# Patient Record
Sex: Female | Born: 1955
Health system: Southern US, Community
[De-identification: ages and names within clinical notes are randomized; demographics above are authoritative.]

## PROBLEM LIST (undated history)

## (undated) DIAGNOSIS — R232 Flushing: Secondary | ICD-10-CM

## (undated) DIAGNOSIS — J439 Emphysema, unspecified: Secondary | ICD-10-CM

## (undated) DIAGNOSIS — M545 Low back pain, unspecified: Secondary | ICD-10-CM

## (undated) DIAGNOSIS — I341 Nonrheumatic mitral (valve) prolapse: Secondary | ICD-10-CM

## (undated) DIAGNOSIS — Z720 Tobacco use: Secondary | ICD-10-CM

## (undated) DIAGNOSIS — E782 Mixed hyperlipidemia: Secondary | ICD-10-CM

## (undated) DIAGNOSIS — F419 Anxiety disorder, unspecified: Secondary | ICD-10-CM

## (undated) HISTORY — DX: Mixed hyperlipidemia: E78.2

## (undated) HISTORY — DX: Flushing: R23.2

## (undated) HISTORY — DX: Tobacco use: Z72.0

## (undated) HISTORY — PX: OTHER SURGICAL HISTORY: SHX169

## (undated) HISTORY — DX: Anxiety disorder, unspecified: F41.9

## (undated) HISTORY — DX: Low back pain, unspecified: M54.50

## (undated) HISTORY — PX: COLONOSCOPY: SHX174

## (undated) HISTORY — DX: Low back pain: M54.5

## (undated) HISTORY — DX: Emphysema, unspecified: J43.9

---

## 1999-06-03 ENCOUNTER — Encounter: Payer: Self-pay | Admitting: Obstetrics and Gynecology

## 1999-06-03 ENCOUNTER — Encounter: Admission: RE | Admit: 1999-06-03 | Discharge: 1999-06-03 | Payer: Self-pay | Admitting: Obstetrics and Gynecology

## 1999-11-15 ENCOUNTER — Emergency Department (HOSPITAL_COMMUNITY): Admission: EM | Admit: 1999-11-15 | Discharge: 1999-11-15 | Payer: Self-pay | Admitting: Emergency Medicine

## 2000-02-08 ENCOUNTER — Encounter: Payer: Self-pay | Admitting: Obstetrics and Gynecology

## 2000-02-08 ENCOUNTER — Encounter: Admission: RE | Admit: 2000-02-08 | Discharge: 2000-02-08 | Payer: Self-pay | Admitting: Obstetrics and Gynecology

## 2000-04-25 ENCOUNTER — Encounter (INDEPENDENT_AMBULATORY_CARE_PROVIDER_SITE_OTHER): Payer: Self-pay | Admitting: Specialist

## 2000-04-25 ENCOUNTER — Other Ambulatory Visit: Admission: RE | Admit: 2000-04-25 | Discharge: 2000-04-25 | Payer: Self-pay | Admitting: Obstetrics and Gynecology

## 2000-07-13 ENCOUNTER — Other Ambulatory Visit: Admission: RE | Admit: 2000-07-13 | Discharge: 2000-07-13 | Payer: Self-pay | Admitting: Obstetrics and Gynecology

## 2000-07-17 ENCOUNTER — Encounter: Payer: Self-pay | Admitting: Obstetrics and Gynecology

## 2000-07-17 ENCOUNTER — Encounter: Admission: RE | Admit: 2000-07-17 | Discharge: 2000-07-17 | Payer: Self-pay | Admitting: Obstetrics and Gynecology

## 2001-08-12 ENCOUNTER — Encounter: Admission: RE | Admit: 2001-08-12 | Discharge: 2001-08-12 | Payer: Self-pay | Admitting: Obstetrics and Gynecology

## 2001-08-12 ENCOUNTER — Encounter: Payer: Self-pay | Admitting: Obstetrics and Gynecology

## 2002-08-22 ENCOUNTER — Encounter: Admission: RE | Admit: 2002-08-22 | Discharge: 2002-08-22 | Payer: Self-pay | Admitting: Obstetrics and Gynecology

## 2002-08-22 ENCOUNTER — Encounter: Payer: Self-pay | Admitting: Obstetrics and Gynecology

## 2002-09-16 ENCOUNTER — Other Ambulatory Visit: Admission: RE | Admit: 2002-09-16 | Discharge: 2002-09-16 | Payer: Self-pay | Admitting: Obstetrics and Gynecology

## 2003-09-15 ENCOUNTER — Encounter: Admission: RE | Admit: 2003-09-15 | Discharge: 2003-09-15 | Payer: Self-pay | Admitting: Obstetrics and Gynecology

## 2004-09-15 ENCOUNTER — Encounter: Admission: RE | Admit: 2004-09-15 | Discharge: 2004-09-15 | Payer: Self-pay | Admitting: Obstetrics and Gynecology

## 2005-04-28 ENCOUNTER — Emergency Department (HOSPITAL_COMMUNITY): Admission: EM | Admit: 2005-04-28 | Discharge: 2005-04-28 | Payer: Self-pay | Admitting: Emergency Medicine

## 2005-09-18 ENCOUNTER — Encounter: Admission: RE | Admit: 2005-09-18 | Discharge: 2005-09-18 | Payer: Self-pay | Admitting: Obstetrics and Gynecology

## 2006-09-20 ENCOUNTER — Encounter: Admission: RE | Admit: 2006-09-20 | Discharge: 2006-09-20 | Payer: Self-pay | Admitting: Obstetrics and Gynecology

## 2007-03-06 ENCOUNTER — Ambulatory Visit (HOSPITAL_BASED_OUTPATIENT_CLINIC_OR_DEPARTMENT_OTHER): Admission: RE | Admit: 2007-03-06 | Discharge: 2007-03-06 | Payer: Self-pay | Admitting: Obstetrics and Gynecology

## 2007-03-06 ENCOUNTER — Encounter (INDEPENDENT_AMBULATORY_CARE_PROVIDER_SITE_OTHER): Payer: Self-pay | Admitting: Obstetrics and Gynecology

## 2007-10-17 ENCOUNTER — Encounter: Admission: RE | Admit: 2007-10-17 | Discharge: 2007-10-17 | Payer: Self-pay | Admitting: Obstetrics and Gynecology

## 2008-02-17 ENCOUNTER — Emergency Department (HOSPITAL_COMMUNITY): Admission: EM | Admit: 2008-02-17 | Discharge: 2008-02-17 | Payer: Self-pay | Admitting: Emergency Medicine

## 2008-10-20 ENCOUNTER — Encounter: Admission: RE | Admit: 2008-10-20 | Discharge: 2008-10-20 | Payer: Self-pay | Admitting: Obstetrics and Gynecology

## 2009-12-02 ENCOUNTER — Encounter: Admission: RE | Admit: 2009-12-02 | Discharge: 2009-12-02 | Payer: Self-pay | Admitting: Obstetrics and Gynecology

## 2009-12-16 ENCOUNTER — Encounter: Admission: RE | Admit: 2009-12-16 | Discharge: 2009-12-16 | Payer: Self-pay | Admitting: Obstetrics and Gynecology

## 2010-03-05 ENCOUNTER — Encounter: Payer: Self-pay | Admitting: Obstetrics and Gynecology

## 2010-05-30 LAB — CBC
HCT: 43.1 % (ref 36.0–46.0)
Hemoglobin: 14.9 g/dL (ref 12.0–15.0)
MCHC: 34.6 g/dL (ref 30.0–36.0)
Platelets: 320 10*3/uL (ref 150–400)
RDW: 13.1 % (ref 11.5–15.5)

## 2010-05-30 LAB — POCT I-STAT, CHEM 8
BUN: 14 mg/dL (ref 6–23)
Calcium, Ion: 0.98 mmol/L — ABNORMAL LOW (ref 1.12–1.32)
Chloride: 106 mEq/L (ref 96–112)
Creatinine, Ser: 1 mg/dL (ref 0.4–1.2)
Glucose, Bld: 92 mg/dL (ref 70–99)
TCO2: 25 mmol/L (ref 0–100)

## 2010-05-30 LAB — DIFFERENTIAL
Basophils Absolute: 0.1 10*3/uL (ref 0.0–0.1)
Basophils Relative: 1 % (ref 0–1)
Eosinophils Absolute: 0 10*3/uL (ref 0.0–0.7)
Eosinophils Relative: 1 % (ref 0–5)
Lymphocytes Relative: 27 % (ref 12–46)
Monocytes Absolute: 0.5 10*3/uL (ref 0.1–1.0)

## 2010-05-30 LAB — POCT CARDIAC MARKERS
Troponin i, poc: <0.05 ng/mL (ref 0.00–0.09)
Troponin i, poc: <0.05 ng/mL (ref 0.00–0.09)

## 2010-06-10 ENCOUNTER — Other Ambulatory Visit: Payer: Self-pay | Admitting: Obstetrics & Gynecology

## 2010-06-28 NOTE — Op Note (Signed)
NAMELEZLI, DANEK NO.:  [id number]   MEDICAL RECORD NO.:  [id number]          PATIENT TYPE:  AMB   LOCATION:  [facility]                         FACILITY:  [facility]   PHYSICIAN:  Sherry A. Dickstein, M.D.DATE OF BIRTH:  July 21, 1955   DATE OF PROCEDURE:  03/06/2007  DATE OF DISCHARGE:                               OPERATIVE REPORT   PREOPERATIVE DIAGNOSES:  Abnormal Papanicolaou smear, endometrial polyp.   POSTOPERATIVE DIAGNOSES:  Abnormal Papanicolaou smear, endometrial  polyp.   OPERATIVE PROCEDURE:  Dilatation and curettage, hysteroscopy with  resectoscope.   SURGEON:  Sherry A. Rosalio Macadamia, M.D.   ANESTHESIA:  Monitored anesthesia care.   INDICATIONS FOR PROCEDURE:  This is a 55 year old G-3, P-2, 0-1-2 woman  who has had no vaginal bleeding except for late spotting in July.  Her  last true flow was December 2007.  The patient had an abnormal Pap smear  which was worked up with a normal colposcopy with biopsies, however, she  had an ultrasound which revealed thickened endometrium.  The patient  then had a sonohysterogram which revealed an endometrial polyp present.  Because of the presence of endometrial polyp and her past history of  endometrial hyperplasia, the patient is brought to the operating room  for dilatation and curettage hysteroscopy with resectoscope.   FINDINGS:  Normal sized anteflexed uterus, no adnexal mass with small  endometrial polyp.   DESCRIPTION OF PROCEDURE:  The patient was brought into the operating  room and given adequate intravenous sedation.  She was placed in the  dorsal lithotomy position.  Her perineum was washed with Betadine.  Pelvic examination was performed.  She was draped in a sterile fashion.  Speculum was placed within the vagina.  The vagina was washed with  Betadine.  Paracervical block was administered with 1% Nesacaine.  The  anterior lip of the cervix was grasped with a single tooth tenaculum.  Cervix was dilated  with Pratt dilators up to a #31.  The hysteroscope  was introduced into the endometrial cavity.  Pictures were obtained.  Using a double looped right angled resector, the endometrial polyp was  removed with endometrial resections.  The endometrial tissue was sampled  circumferentially very superficially.  Once this was done, adequate  hemostasis was present.  Pictures were obtained before and after the  resections were obtained.  The instruments were removed from the vagina.  The patient was taken out of dorsal lithotomy position.  She was  awakened.  She was removed from the operating table to a stretcher in  stable condition.   COMPLICATIONS:  None.   ESTIMATED BLOOD LOSS:  Less than 5 mL.   SPECIMEN:  Endometrial resections with endometrial polyp sent to  pathology.      Sherry A. Rosalio Macadamia, M.D.  Electronically Signed     SAD/MEDQ  D:  03/06/2007  T:  03/06/2007  Job:  [id number]

## 2010-07-01 NOTE — Consult Note (Signed)
NAMEDONABELLE, MOLDEN NO.:  [id number]   MEDICAL RECORD NO.:  [id number]          PATIENT TYPE:  EMS   LOCATION:  [facility]                         FACILITY:  [facility]   PHYSICIAN:  Francisca December, M.D.  DATE OF BIRTH:  [birth year]   DATE OF CONSULTATION:  04/28/2005  DATE OF DISCHARGE:                                   CONSULTATION   PHYSICIANS:  Cardiologist: Francisca December, M.D.  Primary care physician: Susa Raring, M.D.   CHIEF COMPLAINT:  Chest pain, palpitations, and left arm numbness and  tingling.   HISTORY OF PRESENT ILLNESS:  Mrs. Zurcher is a 55 year old Caucasian female  with a history of mitral valve prolapse on SBE prophylaxis and tobacco  abuse.  This morning on her drive into work, she complained of left arm  numbness and tingling for approximately 2 to 3 hours.  Upon arrival to work,  she experienced dizziness without syncope. She has a history of palpitations  with the onset of chest discomfort.  For the past 2 weeks, she has  experienced intermittent palpitations with sharp chest pain in the left mid  sternal area.  This recurred this morning.  The pain was rated a 3 to 4/10  and did not radiate.  The patient complained of shortness of breath that was  relieved with laying her head down.  She also complained of nausea for a few  seconds without vomiting.  She denied diaphoresis.  While at work, she took  aspirin 325 mg x1 without pain relief.  She married to the [facility]  Emergency Department by EMS.  While in the EMS vehicle, she was given  sublingual nitroglycerin x2 with pain improvement.  Upon arrival to the  emergency department, a 12-lead EKG was obtained which revealed normal sinus  rhythm with a ventricular rate of 76 beats per minute.  The patient was  placed on oxygen via nasal cannula at 2 liters per minute.  She is currently  pain free, however, admits to continued, intermittent palpitations and left  arm numbness and tingling.  Her  point-of-care cardiac markers are negative  x1.  The patient had a normal stress Cardiolite in October 2006 with EF of  72%.   PAST MEDICAL HISTORY:  1.  Mitral valve prolapse on SBE prophylaxis.  2.  Tobacco abuse.   ALLERGIES:  No known drug allergies.   MEDICATIONS:  Xanax, unknown dosage, as needed for palpitations.   PAST SURGICAL HISTORY:  Questionable colposcopy for abnormal Pap smear.   FAMILY HISTORY:  Father deceased at age 47 of an MI.   SOCIAL HISTORY:  Married 25 years with 2 sons, ages 29 and 48.  She lives  with her husband. The patient admits to tobacco abuse.  She has been smoking  1 to 1-1/2 packs of cigarettes for 17 years.  She admits to occasional  alcohol use; however, denies illicit drug use.  She denies a current  exercise regimen.   REVIEW OF SYSTEMS:  The patient admits to chest pain, palpitations, left arm  numbness and tingling, nausea, shortness of breath, dizziness, occasional  lower  extremity edema.  She denies diaphoresis, syncope, vomiting.  All  other systems reviewed are negative other than what is stated in the HPI.   PHYSICAL EXAMINATION:  GENERAL:  A 55 year old female, pleasant and  cooperative, no acute distress.  VITAL SIGNS: Temperature 98, pulse 68, respirations 20, blood pressure  118/68, O2 saturation 99% on 2 liters.  HEENT: Unremarkable.  NECK:  Supple without JVD or carotid bruits bilaterally.  LUNGS: Breath sounds are equal and clear to auscultation bilaterally without  wheezes, rhonchi, crackles, or rales.  HEART: Regular rate and rhythm.  S1 and S2 normal without murmurs, gallops,  clicks, or rubs  ABDOMEN:  Soft nontender, nondistended, with active bowel sounds.  No aortic  or iliac bruits.  No masses or hepatomegaly.  SKIN: Warm and dry without rash or lesion.  NEUROLOGIC: Alert and oriented x3.  No focal deficits.  PSYCHIATRIC: Normal mood and affect.   LABORATORY DATA:  Point-of-care cardiac enzymes: Myoglobin 37, CK-MB  less  than 1, troponin less than 0.05.   EKG: Normal sinus rhythm with a ventricular rate of 76 beats per minute.   Chest x-ray: No active cardiopulmonary disease.   ASSESSMENT:  1.  Atypical angina associated with single palpitation.  2.  Mitral valve prolapse; I cannot hear a click.  3.  Tobacco abuse, negative alcohol.  4.  Positive family history.  First set of cardiac markers are negative.  No      reflux symptoms.   PLAN:  1.  Obtain repeat point-of-care cardiac markers as patient is considered low      risk.  If negative, the patient can be discharged with a prescription      for sublingual nitroglycerin and encouraged to begin aspirin 325 mg      daily.  2.  Arrange for exercise Cardiolite next week in the office.  3.  Return to the emergency room promptly for recurrent chest pain.  4.  The patient was seen and examined by Dr. Amil Amen who participated in the      medical decision making and plan of care.   ADDENDUM:  It should be noted that the second set of point-of-care markers  were negative with a myoglobin of 40.1, CK-MB less than 1, and troponin I  0.05.  The patient continues to deny chest pain.  She is being discharged to  home in stable condition with an prescription for sublingual nitroglycerin.  She will also be started on enteric-coated  aspirin 325 mg p.o. daily. The patient has been scheduled for an exercise  stress Cardiolite test on Thursday, May 04, 2005, at 8:30 a.m. in the  [facility].  Instructions prior to this test will be mailed to  the patient's home address to include nothing by mouth except for  medications after midnight and no smoking the day of the test.      Tylene Fantasia, PA      Francisca December, M.D.  Electronically Signed    RDM/MEDQ  D:  04/28/2005  T:  04/29/2005  Job:  [id number]   cc:   Susa Raring, M.D.

## 2010-11-03 LAB — POCT HEMOGLOBIN-HEMACUE
Hemoglobin: 15
Operator id: 268271

## 2010-11-28 ENCOUNTER — Other Ambulatory Visit: Payer: Self-pay | Admitting: Obstetrics & Gynecology

## 2010-11-28 DIAGNOSIS — Z1231 Encounter for screening mammogram for malignant neoplasm of breast: Secondary | ICD-10-CM

## 2010-12-19 ENCOUNTER — Ambulatory Visit
Admission: RE | Admit: 2010-12-19 | Discharge: 2010-12-19 | Disposition: A | Discharge status: Home / Self Care | Payer: Managed Care, Other (non HMO) | Source: Ambulatory Visit | Attending: Obstetrics & Gynecology | Admitting: Obstetrics & Gynecology

## 2010-12-19 DIAGNOSIS — Z1231 Encounter for screening mammogram for malignant neoplasm of breast: Secondary | ICD-10-CM

## 2011-09-13 ENCOUNTER — Emergency Department (HOSPITAL_BASED_OUTPATIENT_CLINIC_OR_DEPARTMENT_OTHER)
Admission: EM | Admit: 2011-09-13 | Discharge: 2011-09-13 | Disposition: A | Discharge status: Home / Self Care | Payer: Managed Care, Other (non HMO) | Attending: Emergency Medicine | Admitting: Emergency Medicine

## 2011-09-13 ENCOUNTER — Encounter (HOSPITAL_BASED_OUTPATIENT_CLINIC_OR_DEPARTMENT_OTHER): Payer: Self-pay

## 2011-09-13 ENCOUNTER — Emergency Department (HOSPITAL_BASED_OUTPATIENT_CLINIC_OR_DEPARTMENT_OTHER): Payer: Managed Care, Other (non HMO)

## 2011-09-13 DIAGNOSIS — Z7982 Long term (current) use of aspirin: Secondary | ICD-10-CM | POA: Insufficient documentation

## 2011-09-13 DIAGNOSIS — F17200 Nicotine dependence, unspecified, uncomplicated: Secondary | ICD-10-CM | POA: Insufficient documentation

## 2011-09-13 DIAGNOSIS — R002 Palpitations: Secondary | ICD-10-CM | POA: Insufficient documentation

## 2011-09-13 DIAGNOSIS — R0789 Other chest pain: Secondary | ICD-10-CM | POA: Insufficient documentation

## 2011-09-13 DIAGNOSIS — R0602 Shortness of breath: Secondary | ICD-10-CM | POA: Insufficient documentation

## 2011-09-13 HISTORY — DX: Nonrheumatic mitral (valve) prolapse: I34.1

## 2011-09-13 LAB — COMPREHENSIVE METABOLIC PANEL
ALT: 31 U/L (ref 0–35)
Albumin: 4.2 g/dL (ref 3.5–5.2)
Alkaline Phosphatase: 81 U/L (ref 39–117)
Chloride: 103 mEq/L (ref 96–112)
Glucose, Bld: 98 mg/dL (ref 70–99)
Potassium: 3.8 mEq/L (ref 3.5–5.1)
Sodium: 140 mEq/L (ref 135–145)
Total Bilirubin: 0.2 mg/dL — ABNORMAL LOW (ref 0.3–1.2)
Total Protein: 7.1 g/dL (ref 6.0–8.3)

## 2011-09-13 LAB — URINALYSIS, ROUTINE W REFLEX MICROSCOPIC
Glucose, UA: NEGATIVE mg/dL
Leukocytes, UA: NEGATIVE
Nitrite: NEGATIVE
Specific Gravity, Urine: 1.007 (ref 1.005–1.030)
pH: 7 (ref 5.0–8.0)

## 2011-09-13 LAB — CBC WITH DIFFERENTIAL/PLATELET
Basophils Relative: 1 % (ref 0–1)
Eosinophils Absolute: 0.1 10*3/uL (ref 0.0–0.7)
Hemoglobin: 13.5 g/dL (ref 12.0–15.0)
Lymphs Abs: 3.3 10*3/uL (ref 0.7–4.0)
MCH: 30.5 pg (ref 26.0–34.0)
Monocytes Relative: 7 % (ref 3–12)
Neutro Abs: 5.7 10*3/uL (ref 1.7–7.7)
Neutrophils Relative %: 58 % (ref 43–77)
Platelets: 256 10*3/uL (ref 150–400)
RBC: 4.43 MIL/uL (ref 3.87–5.11)
WBC: 9.8 10*3/uL (ref 4.0–10.5)

## 2011-09-13 LAB — CARDIAC PANEL(CRET KIN+CKTOT+MB+TROPI)
Relative Index: 1.6 (ref 0.0–2.5)
Relative Index: 1.5 (ref 0.0–2.5)

## 2011-09-13 LAB — D-DIMER, QUANTITATIVE: D-Dimer, Quant: <0.22 ug/mL-FEU (ref 0.00–0.48)

## 2011-09-13 NOTE — ED Notes (Signed)
C/o palpitations x 1 week-today while seated at work started having sharp pain to left arm and left shoulder-denies sob, "normal hot flashes", no cold sweat,n/v

## 2011-09-13 NOTE — ED Provider Notes (Signed)
History     CSN: [id number]  Arrival date & time 09/13/11  1143   First MD Initiated Contact with Patient 09/13/11 1201      Chief Complaint  Patient presents with  . Palpitations    (Consider location/radiation/quality/duration/timing/severity/associated sxs/prior treatment) HPI Comments: Patient states history of mitral valve prolapse is having palpitations intermittently for the past week. She has severe episode today while sitting at work and developed shooting pain in her left arm and shoulder. This pain lasted about 5 minutes and resolved. She also had pain in her left upper back. She's not had pain like this in the past. No shortness of breath, vomiting, vomiting, fever, chills. No chest pain. She reports having a negative stress test in 2006 but none since. Denies any history of heart attack. Currently feels back to normal. Has not seen her cardiologist at [facility] in several years.  The history is provided by the patient.    Past Medical History  Diagnosis Date  . MVP (mitral valve prolapse)     History reviewed. No pertinent past surgical history.  No family history on file.  History  Substance Use Topics  . Smoking status: Current Everyday Smoker  . Smokeless tobacco: Not on file  . Alcohol Use: Yes    OB History    Grav Para Term Preterm Abortions TAB SAB Ect Mult Living                  Review of Systems  Constitutional: Negative for fever, activity change and appetite change.  HENT: Negative for congestion and rhinorrhea.   Eyes: Negative for visual disturbance.  Respiratory: Negative for cough, chest tightness and shortness of breath.   Cardiovascular: Positive for palpitations. Negative for chest pain.  Gastrointestinal: Negative for nausea, vomiting and abdominal pain.  Genitourinary: Negative for dysuria, hematuria, vaginal bleeding and vaginal discharge.  Musculoskeletal: Negative for back pain.  Skin: Negative for rash.  Neurological: Negative  for dizziness, weakness and headaches.    Allergies  Review of patient's allergies indicates no known allergies.  Home Medications   Current Outpatient Rx  Name Route Sig Dispense Refill  . ALPRAZOLAM 0.5 MG PO TABS Oral Take 0.5-1 mg by mouth at bedtime as needed. anxiety    . AMOXICILLIN 500 MG PO CAPS Oral Take 2,000 mg by mouth once. Before dental appt.    . ASPIRIN 81 MG PO TABS Oral Take 81 mg by mouth daily.    . ATORVASTATIN CALCIUM 40 MG PO TABS Oral Take 40 mg by mouth daily.    Marland Kitchen GLUCOSAMINE COMPLEX PO Oral Take by mouth.    . IBUPROFEN 200 MG PO CAPS Oral Take 400 mg by mouth daily as needed. pain      BP 134/81  Pulse 75  Temp 97.9 F (36.6 C) (Oral)  Resp 20  Ht 5\' 6"  (1.676 m)  Wt 154 lb (69.854 kg)  BMI 24.86 kg/m2  SpO2 100%  Physical Exam  Constitutional: She is oriented to person, place, and time. She appears well-developed and well-nourished.  HENT:  Head: Normocephalic and atraumatic.  Mouth/Throat: Oropharynx is clear and moist. No oropharyngeal exudate.  Eyes: Conjunctivae and EOM are normal. Pupils are equal, round, and reactive to light.  Neck: Normal range of motion. Neck supple.  Cardiovascular: Normal rate, regular rhythm and normal heart sounds.   Pulmonary/Chest: Effort normal and breath sounds normal. No respiratory distress.  Abdominal: Soft. There is no tenderness. There is no rebound and no  guarding.  Musculoskeletal: Normal range of motion. She exhibits no edema and no tenderness.       Equal grip strength bilaterally  Neurological: She is alert and oriented to person, place, and time. No cranial nerve deficit.  Skin: Skin is warm.    ED Course  Procedures (including critical care time)  Labs Reviewed  COMPREHENSIVE METABOLIC PANEL - Abnormal; Notable for the following:    Total Bilirubin 0.2 (*)     GFR calc non Af Amer 81 (*)     All other components within normal limits  CBC WITH DIFFERENTIAL  CARDIAC PANEL(CRET  KIN+CKTOT+MB+TROPI)  URINALYSIS, ROUTINE W REFLEX MICROSCOPIC  D-DIMER, QUANTITATIVE   Dg Chest 2 View  09/13/2011  *RADIOLOGY REPORT*  Clinical Data: Short of breath  CHEST - 2 VIEW  Comparison: 02/17/2008  Findings: Normal heart size.  There is a focal opacity projecting over the lower lumbar spine on the lateral view that is not clearly defined on the PA view.  The no pneumothorax.  No pleural effusion. No acute bony deformity.  IMPRESSION: Basilar atelectasis worse airspace disease on the lateral view only.  Follow-up studies until resolution are recommended.  Original Report Authenticated By: Donavan Burnet, M.D.     No diagnosis found.    MDM  Palpitations that have been intermittent for the past week associated with left arm pain today for 5 minutes but is now resolved. Vital stable, no distress. EKG with inferior lateral ST depressions which are similar to previous.  Chest x-ray negative. EKG nonischemic, stable inferolateral ST depressions, T wave inversion lead 3 Shooting arm pain atypical for ACS. Troponin negative, d-dimer negative. D/w Dr. Eldridge Dace of [facility] cardiology.  Agrees symptoms atypical for angina.  Agrees with d-dimer and 2 sets of cardiac enzymes for outpatient stress.  D-dimer negative. Delta troponin negative. Patient pain-free though still having the palpitations intermittently.  No arrhythmias on monitor..   Rate: 89  Rhythm: normal sinus rhythm  QRS Axis: normal  Intervals: normal  ST/T Wave abnormalities: ST depressions inferiorly and ST depressions laterally, TWI lead 3  Conduction Disutrbances:none  Narrative Interpretation: ST depressions appears stable from 2010  Old EKG Reviewed: unchanged   Glynn Octave, MD 09/13/11 1601

## 2012-01-22 ENCOUNTER — Other Ambulatory Visit: Payer: Self-pay | Admitting: Obstetrics & Gynecology

## 2012-01-22 DIAGNOSIS — Z1231 Encounter for screening mammogram for malignant neoplasm of breast: Secondary | ICD-10-CM

## 2012-02-23 ENCOUNTER — Ambulatory Visit
Admission: RE | Admit: 2012-02-23 | Discharge: 2012-02-23 | Disposition: A | Discharge status: Home / Self Care | Payer: Managed Care, Other (non HMO) | Source: Ambulatory Visit | Attending: Obstetrics & Gynecology | Admitting: Obstetrics & Gynecology

## 2012-02-23 DIAGNOSIS — Z1231 Encounter for screening mammogram for malignant neoplasm of breast: Secondary | ICD-10-CM

## 2012-05-15 ENCOUNTER — Other Ambulatory Visit: Payer: Self-pay | Admitting: Dermatology

## 2013-02-24 ENCOUNTER — Other Ambulatory Visit: Payer: Self-pay

## 2013-02-24 DIAGNOSIS — Z1231 Encounter for screening mammogram for malignant neoplasm of breast: Secondary | ICD-10-CM

## 2013-03-17 ENCOUNTER — Ambulatory Visit
Admission: RE | Admit: 2013-03-17 | Discharge: 2013-03-17 | Disposition: A | Discharge status: Home / Self Care | Payer: Self-pay | Source: Ambulatory Visit

## 2013-03-17 DIAGNOSIS — Z1231 Encounter for screening mammogram for malignant neoplasm of breast: Secondary | ICD-10-CM

## 2013-10-29 ENCOUNTER — Emergency Department (HOSPITAL_BASED_OUTPATIENT_CLINIC_OR_DEPARTMENT_OTHER): Payer: Managed Care, Other (non HMO)

## 2013-10-29 ENCOUNTER — Emergency Department (HOSPITAL_BASED_OUTPATIENT_CLINIC_OR_DEPARTMENT_OTHER)
Admission: EM | Admit: 2013-10-29 | Discharge: 2013-10-29 | Disposition: A | Discharge status: Home / Self Care | Payer: Managed Care, Other (non HMO) | Attending: Emergency Medicine | Admitting: Emergency Medicine

## 2013-10-29 ENCOUNTER — Encounter (HOSPITAL_BASED_OUTPATIENT_CLINIC_OR_DEPARTMENT_OTHER): Payer: Self-pay | Admitting: Emergency Medicine

## 2013-10-29 DIAGNOSIS — Z791 Long term (current) use of non-steroidal anti-inflammatories (NSAID): Secondary | ICD-10-CM | POA: Diagnosis not present

## 2013-10-29 DIAGNOSIS — Z8679 Personal history of other diseases of the circulatory system: Secondary | ICD-10-CM | POA: Diagnosis not present

## 2013-10-29 DIAGNOSIS — R079 Chest pain, unspecified: Secondary | ICD-10-CM | POA: Diagnosis present

## 2013-10-29 DIAGNOSIS — F17200 Nicotine dependence, unspecified, uncomplicated: Secondary | ICD-10-CM | POA: Diagnosis not present

## 2013-10-29 DIAGNOSIS — Z7982 Long term (current) use of aspirin: Secondary | ICD-10-CM | POA: Diagnosis not present

## 2013-10-29 DIAGNOSIS — R0789 Other chest pain: Secondary | ICD-10-CM | POA: Diagnosis not present

## 2013-10-29 DIAGNOSIS — Z792 Long term (current) use of antibiotics: Secondary | ICD-10-CM | POA: Diagnosis not present

## 2013-10-29 DIAGNOSIS — Z79899 Other long term (current) drug therapy: Secondary | ICD-10-CM | POA: Insufficient documentation

## 2013-10-29 LAB — COMPREHENSIVE METABOLIC PANEL
ALBUMIN: 3.9 g/dL (ref 3.5–5.2)
ALK PHOS: 82 U/L (ref 39–117)
ALT: 30 U/L (ref 0–35)
AST: 26 U/L (ref 0–37)
Anion gap: 14 (ref 5–15)
BUN: 15 mg/dL (ref 6–23)
CALCIUM: 9.3 mg/dL (ref 8.4–10.5)
CO2: 25 mEq/L (ref 19–32)
Chloride: 103 mEq/L (ref 96–112)
Creatinine, Ser: 0.9 mg/dL (ref 0.50–1.10)
GFR calc non Af Amer: 69 mL/min — ABNORMAL LOW (ref >90)
GFR, EST AFRICAN AMERICAN: 80 mL/min — AB (ref >90)
Glucose, Bld: 99 mg/dL (ref 70–99)
POTASSIUM: 3.8 meq/L (ref 3.7–5.3)
SODIUM: 142 meq/L (ref 137–147)
TOTAL PROTEIN: 7.2 g/dL (ref 6.0–8.3)
Total Bilirubin: 0.3 mg/dL (ref 0.3–1.2)

## 2013-10-29 LAB — CBC WITH DIFFERENTIAL/PLATELET
BASOS ABS: 0 10*3/uL (ref 0.0–0.1)
BASOS PCT: 0 % (ref 0–1)
EOS ABS: 0.2 10*3/uL (ref 0.0–0.7)
EOS PCT: 2 % (ref 0–5)
HCT: 39.4 % (ref 36.0–46.0)
Hemoglobin: 13.1 g/dL (ref 12.0–15.0)
Lymphocytes Relative: 30 % (ref 12–46)
Lymphs Abs: 2.8 10*3/uL (ref 0.7–4.0)
MCH: 30.5 pg (ref 26.0–34.0)
MCHC: 33.2 g/dL (ref 30.0–36.0)
MCV: 91.6 fL (ref 78.0–100.0)
Monocytes Absolute: 0.5 10*3/uL (ref 0.1–1.0)
Monocytes Relative: 6 % (ref 3–12)
NEUTROS PCT: 62 % (ref 43–77)
Neutro Abs: 5.7 10*3/uL (ref 1.7–7.7)
PLATELETS: 275 10*3/uL (ref 150–400)
RBC: 4.3 MIL/uL (ref 3.87–5.11)
RDW: 12.9 % (ref 11.5–15.5)
WBC: 9.2 10*3/uL (ref 4.0–10.5)

## 2013-10-29 LAB — TROPONIN I: Troponin I: <0.3 ng/mL (ref <0.30)

## 2013-10-29 NOTE — ED Notes (Signed)
Pt states she took her xanax around 11:30 with onset of this pressure, pt states "I think it is kicking in right now, I'm feeling better."

## 2013-10-29 NOTE — Discharge Instructions (Signed)

## 2013-10-29 NOTE — ED Provider Notes (Signed)
CSN: [id number]     Arrival date & time 10/29/13  1233 History   First MD Initiated Contact with Patient 10/29/13 1244     Chief Complaint  Patient presents with  . Chest Pain     (Consider location/radiation/quality/duration/timing/severity/associated sxs/prior Treatment) HPI Comments: Patient is a 58 year old female with no prior cardiac history. She presents with complaints of pressure in her chest that she states has been coming and going for the past 2-3 months. It occurred again today at work and was worse than before, so she presents for evaluation. She denies any nausea, diaphoresis, shortness of breath, or radiation to the arm or jaw. She did take a Xanax which seems to be helping. She had no relief with nitroglycerin by EMS.  Patient is a 58 y.o. female presenting with chest pain. The history is provided by the patient.  Chest Pain Pain location:  Substernal area Pain quality: pressure   Pain radiates to:  Does not radiate Pain radiates to the back: no   Pain severity:  Moderate Onset quality:  Gradual Duration:  2 hours Timing:  Intermittent Progression:  Partially resolved Chronicity:  New   Past Medical History  Diagnosis Date  . MVP (mitral valve prolapse)    History reviewed. No pertinent past surgical history. History reviewed. No pertinent family history. History  Substance Use Topics  . Smoking status: Current Every Day Smoker  . Smokeless tobacco: Not on file  . Alcohol Use: Yes   OB History   Grav Para Term Preterm Abortions TAB SAB Ect Mult Living                 Review of Systems  Cardiovascular: Positive for chest pain.  All other systems reviewed and are negative.     Allergies  Review of patient's allergies indicates no known allergies.  Home Medications   Prior to Admission medications   Medication Sig Start Date End Date Taking? Authorizing Provider  ALPRAZolam Duanne Moron) 0.5 MG tablet Take 0.5-1 mg by mouth at bedtime as needed.  anxiety    Historical Provider, MD  amoxicillin (AMOXIL) 500 MG capsule Take 2,000 mg by mouth once. Before dental appt.    Historical Provider, MD  aspirin 81 MG tablet Take 81 mg by mouth daily.    Historical Provider, MD  atorvastatin (LIPITOR) 40 MG tablet Take 40 mg by mouth daily.    Historical Provider, MD  Boswellia-Glucosamine-Vit D (GLUCOSAMINE COMPLEX PO) Take by mouth.    Historical Provider, MD  Ibuprofen (ADVIL) 200 MG CAPS Take 400 mg by mouth daily as needed. pain    Historical Provider, MD   BP 135/73  Pulse 73  Temp(Src) 98.5 F (36.9 C) (Oral)  Resp 16  Ht 5\' 5"  (1.651 m)  Wt 154 lb (69.854 kg)  BMI 25.63 kg/m2  SpO2 96% Physical Exam  Nursing note and vitals reviewed. Constitutional: She is oriented to person, place, and time. She appears well-developed and well-nourished. No distress.  HENT:  Head: Normocephalic and atraumatic.  Neck: Normal range of motion. Neck supple.  Cardiovascular: Normal rate and regular rhythm.  Exam reveals no gallop and no friction rub.   No murmur heard. Pulmonary/Chest: Effort normal and breath sounds normal. No respiratory distress. She has no wheezes.  Abdominal: Soft. Bowel sounds are normal. She exhibits no distension. There is no tenderness.  Musculoskeletal: Normal range of motion.  Neurological: She is alert and oriented to person, place, and time.  Skin: Skin is warm and dry.  She is not diaphoretic.    ED Course  Procedures (including critical care time) Labs Review Labs Reviewed - No data to display  Imaging Review No results found.   Date: 10/29/2013  Rate: 69  Rhythm: normal sinus rhythm  QRS Axis: normal  Intervals: normal  ST/T Wave abnormalities: normal  Conduction Disutrbances:none  Narrative Interpretation:   Old EKG Reviewed: unchanged    MDM   Final diagnoses:  None    Patient is a 58 year old female who presents with complaints of pressure in her chest. She's been having these symptoms for  the past several months. She felt lightheaded today at work and presents here for evaluation. Blood counts and electrolytes are unremarkable an EKG and troponin did not suggest a cardiac etiology. Patient was kept in the emergency department for a second troponin which was also negative. She has remained symptom free throughout her emergency department stay. Her symptoms are atypical and workup is unremarkable and I strongly doubt a cardiac etiology. She will be discharged to home with instructions to followup with her primary Dr., but understands to return if her symptoms substantially worsen or change.    Veryl Speak, MD 10/30/13 [phone]

## 2013-10-29 NOTE — ED Notes (Signed)
Family at bedside. 

## 2013-10-29 NOTE — ED Notes (Signed)
Pt to room 10 by ems via stretcher. Able to stand and pivot to bed. Pt reports intermittent pressure to her left chest x 2-3 months. Pt also reports feeling "lightheaded" at times for the last 2-3 months. Pt states she is trying to quit smoking and the pressure seems to coincide with when she smokes a cigarette. Pt states she is under a lot of stress at work, and feels that is contributing to her symptoms. ems gave 324mg  baby asa and one ntg sl, with no change in pt symptoms. Iv placed prior to arrival, ekg ordered.

## 2013-12-01 ENCOUNTER — Ambulatory Visit: Payer: Managed Care, Other (non HMO) | Admitting: Interventional Cardiology

## 2013-12-22 ENCOUNTER — Encounter: Payer: Self-pay | Admitting: Interventional Cardiology

## 2013-12-22 ENCOUNTER — Ambulatory Visit (INDEPENDENT_AMBULATORY_CARE_PROVIDER_SITE_OTHER): Payer: Managed Care, Other (non HMO) | Admitting: Interventional Cardiology

## 2013-12-22 VITALS — BP 138/76 | HR 84 | Ht 65.0 in | Wt 158.1 lb

## 2013-12-22 DIAGNOSIS — Z720 Tobacco use: Secondary | ICD-10-CM

## 2013-12-22 DIAGNOSIS — R42 Dizziness and giddiness: Secondary | ICD-10-CM

## 2013-12-22 DIAGNOSIS — I340 Nonrheumatic mitral (valve) insufficiency: Secondary | ICD-10-CM

## 2013-12-22 NOTE — Patient Instructions (Signed)
Your physician wants you to follow-up in: 12 months with Dr Glennon Hamilton will receive a reminder letter in the mail two months in advance. If you don't receive a letter, please call our office to schedule the follow-up appointment.

## 2013-12-22 NOTE — Progress Notes (Signed)
Patient ID: Courtney Hernandez, female   DOB: 1955-10-26, 58 y.o.   MRN: [id number]    [address], [address] [place], [place]  [address] Phone: [phone] Fax:  [phone]  Date:  12/22/2013   ID:  Omnia, Dollinger [birth year], MRN [id number]  PCP:  Gara Kroner, MD      History of Present Illness: Courtney Hernandez is a 58 y.o. female who has a history of mitral valve prolapse. This was diagnosed several years ago. She has had episodic fluttering in her chest as well as some chest discomfort. In September 2015, she did go to the emergency room. She had a negative workup at that time. She has been trying to quit smoking. She has cut back to about 5 cigarettes a day. After decreasing her cigarettes, her chest discomfort improved in the fluttering improved as well. She has had some lightheadedness but this has also improved since cutting back on cigarettes. She does admit to not drinking a lot of water during the day. She is trying to get back to regular walking. She has no chest discomfort with walking. Of note, she had a negative stress test in August 2013. Left ventricular function was normal on echocardiogram in 2013 as well. Overall, she is feeling better.   Wt Readings from Last 3 Encounters:  12/22/13 158 lb 1.9 oz (71.723 kg)  10/29/13 154 lb (69.854 kg)  09/13/11 154 lb (69.854 kg)     Past Medical History  Diagnosis Date  . MVP (mitral valve prolapse)     Current Outpatient Prescriptions  Medication Sig Dispense Refill  . aspirin 81 MG tablet Take 81 mg by mouth daily.    Marland Kitchen atorvastatin (LIPITOR) 40 MG tablet Take 40 mg by mouth daily.    . clonazePAM (KLONOPIN) 0.5 MG tablet   2   No current facility-administered medications for this visit.    Allergies:    Allergies  Allergen Reactions  . Chantix [Varenicline] Other (See Comments)    BAD DREAMS    Social History:  The patient  reports that she has been smoking.  She does not have any smokeless tobacco  history on file. She reports that she drinks alcohol. She reports that she does not use illicit drugs.   Family History:  The patient's family history includes Cancer in her maternal grandmother, sister, and sister; Heart attack in her father.   ROS:  Please see the history of present illness.  No nausea, vomiting.  No fevers, chills.  No focal weakness.  No dysuria. As above   All other systems reviewed and negative.   PHYSICAL EXAM: VS:  BP 138/76 mmHg  Pulse 84  Ht 5\' 5"  (1.651 m)  Wt 158 lb 1.9 oz (71.723 kg)  BMI 26.31 kg/m2 General: Well developed, well nourished, in no acute distress HEENT: normal Neck: no JVD, no carotid bruits Cardiac:  normal S1, S2; RRR;  Lungs:  clear to auscultation bilaterally, no wheezing, rhonchi or rales Abd: soft, nontender, no hepatomegaly Ext: no edema Skin: warm and dry Neuro:   no focal abnormalities noted Psych: normal affect  EKG:  NSR, NSST  ASSESSMENT AND PLAN:  1. Mitral regurgitation: Mild on last echocardiogram. No clear indication for SBE prophylaxis. She had been continuing to use SBE prophylaxis per her dentist. 2. Hyperlipidemia: She has tried to control this with diet. 3. Tobacco abuse: I encouraged her to stop smoking completely. 4. Dizziness: Improving.Try to stay better hydrated. She should  also decrease her caffeine intake.  Signed, Mina Marble, MD, [facility] 12/22/2013 4:48 PM

## 2013-12-26 ENCOUNTER — Encounter: Payer: Self-pay | Admitting: Interventional Cardiology

## 2015-03-14 ENCOUNTER — Encounter (HOSPITAL_COMMUNITY): Payer: Self-pay | Admitting: *Deleted

## 2015-03-14 ENCOUNTER — Emergency Department (HOSPITAL_COMMUNITY): Payer: BLUE CROSS/BLUE SHIELD

## 2015-03-14 ENCOUNTER — Emergency Department (HOSPITAL_COMMUNITY)
Admission: EM | Admit: 2015-03-14 | Discharge: 2015-03-14 | Disposition: A | Discharge status: Home / Self Care | Payer: BLUE CROSS/BLUE SHIELD | Attending: Emergency Medicine | Admitting: Emergency Medicine

## 2015-03-14 DIAGNOSIS — R079 Chest pain, unspecified: Secondary | ICD-10-CM | POA: Insufficient documentation

## 2015-03-14 DIAGNOSIS — Z8679 Personal history of other diseases of the circulatory system: Secondary | ICD-10-CM | POA: Insufficient documentation

## 2015-03-14 DIAGNOSIS — F17200 Nicotine dependence, unspecified, uncomplicated: Secondary | ICD-10-CM | POA: Diagnosis not present

## 2015-03-14 DIAGNOSIS — Z79899 Other long term (current) drug therapy: Secondary | ICD-10-CM | POA: Diagnosis not present

## 2015-03-14 DIAGNOSIS — Z7982 Long term (current) use of aspirin: Secondary | ICD-10-CM | POA: Insufficient documentation

## 2015-03-14 LAB — CBC
HCT: 43.1 % (ref 36.0–46.0)
Hemoglobin: 14.5 g/dL (ref 12.0–15.0)
MCH: 30.7 pg (ref 26.0–34.0)
MCHC: 33.6 g/dL (ref 30.0–36.0)
MCV: 91.1 fL (ref 78.0–100.0)
PLATELETS: 253 10*3/uL (ref 150–400)
RBC: 4.73 MIL/uL (ref 3.87–5.11)
RDW: 12.9 % (ref 11.5–15.5)
WBC: 8.4 10*3/uL (ref 4.0–10.5)

## 2015-03-14 LAB — I-STAT TROPONIN, ED
TROPONIN I, POC: 0.01 ng/mL (ref 0.00–0.08)
Troponin i, poc: 0 ng/mL (ref 0.00–0.08)

## 2015-03-14 LAB — BASIC METABOLIC PANEL
Anion gap: 13 (ref 5–15)
BUN: 14 mg/dL (ref 6–20)
CO2: 25 mmol/L (ref 22–32)
CREATININE: 0.8 mg/dL (ref 0.44–1.00)
Calcium: 9.3 mg/dL (ref 8.9–10.3)
Chloride: 104 mmol/L (ref 101–111)
GFR calc Af Amer: >60 mL/min (ref >60)
GLUCOSE: 103 mg/dL — AB (ref 65–99)
Potassium: 3.5 mmol/L (ref 3.5–5.1)
SODIUM: 142 mmol/L (ref 135–145)

## 2015-03-14 NOTE — ED Provider Notes (Signed)
CSN: [id number]     Arrival date & time 03/14/15  1623 History   First MD Initiated Contact with Patient 03/14/15 1807     Chief Complaint  Patient presents with  . Chest Pain     (Consider location/radiation/quality/duration/timing/severity/associated sxs/prior Treatment) HPI   4 y f w PMH MVP coming in with chest pain.  Chest pain was while driving today, lasted for <5 sec, during which she had central chest tightness w/o associated sx.  She has two episodes and is now chest pain free.  She has never had pain like this before.  She has h/o hl and family hx of cad but no personal hx.  Past Medical History  Diagnosis Date  . MVP (mitral valve prolapse)    History reviewed. No pertinent past surgical history. Family History  Problem Relation Age of Onset  . Heart attack Father   . Cancer Sister   . Cancer Maternal Grandmother   . Cancer Sister    Social History  Substance Use Topics  . Smoking status: Current Every Day Smoker  . Smokeless tobacco: None  . Alcohol Use: Yes   OB History    No data available     Review of Systems  Constitutional: Negative for fever and chills.  HENT: Negative for nosebleeds.   Eyes: Negative for visual disturbance.  Respiratory: Negative for cough and shortness of breath.   Cardiovascular: Positive for chest pain.  Gastrointestinal: Negative for nausea, vomiting, abdominal pain, diarrhea and constipation.  Genitourinary: Negative for dysuria.  Skin: Negative for rash.  Neurological: Negative for weakness.  All other systems reviewed and are negative.     Allergies  Chantix  Home Medications   Prior to Admission medications   Medication Sig Start Date End Date Taking? Authorizing Provider  aspirin 81 MG tablet Take 81 mg by mouth daily.   Yes Historical Provider, MD  atorvastatin (LIPITOR) 40 MG tablet Take 40 mg by mouth daily.   Yes Historical Provider, MD  clonazePAM (KLONOPIN) 0.5 MG tablet  11/11/13  Yes Historical  Provider, MD   BP 147/90 mmHg  Pulse 75  Temp(Src) 98.2 F (36.8 C) (Oral)  Resp 16  Ht 5\' 5"  (1.651 m)  Wt 69.854 kg  BMI 25.63 kg/m2  SpO2 99% Physical Exam  Constitutional: She is oriented to person, place, and time. No distress.  HENT:  Head: Normocephalic and atraumatic.  Eyes: EOM are normal. Pupils are equal, round, and reactive to light.  Neck: Normal range of motion. Neck supple.  Cardiovascular: Normal rate and intact distal pulses.   Pulmonary/Chest: No respiratory distress.  Abdominal: Soft. There is no tenderness.  Musculoskeletal: Normal range of motion.  Neurological: She is alert and oriented to person, place, and time.  Skin: No rash noted. She is not diaphoretic.  Psychiatric: She has a normal mood and affect.    ED Course  Procedures (including critical care time) Labs Review Labs Reviewed  BASIC METABOLIC PANEL - Abnormal; Notable for the following:    Glucose, Bld 103 (*)    All other components within normal limits  CBC  I-STAT TROPOININ, ED  Randolm Idol, ED    Imaging Review Dg Chest 2 View  03/14/2015  CLINICAL DATA:  Mid sternal chest pain twice today. EXAM: CHEST  2 VIEW COMPARISON:  10/29/2013 FINDINGS: Tapering of the peripheral pulmonary vasculature favors emphysema. The lungs appear otherwise clear. Cardiac and mediastinal margins appear normal. No pleural effusion. IMPRESSION: 1. Emphysema.  Otherwise, no significant  abnormalities are observed. Electronically Signed   By: Van Clines M.D.   On: 03/14/2015 17:15   I have personally reviewed and evaluated these images and lab results as part of my medical decision-making.   EKG Interpretation   Date/Time:  Sunday March 14 2015 16:26:18 EST Ventricular Rate:  109 PR Interval:  164 QRS Duration: 84 QT Interval:  336 QTC Calculation: 452 R Axis:   63 Text Interpretation:  Sinus tachycardia Biatrial enlargement Nonspecific  ST and T wave abnormality Abnormal ECG Confirmed  by ZAVITZ  MD, JOSHUA  ([id number]) on 03/14/2015 6:52:13 PM      MDM   Final diagnoses:  Chest pain, unspecified chest pain type   75 y f w PMH MVP coming in with chest pain.  Chest pain was while driving today, lasted for <5 sec  Exam as above unremarkable.  Considering acs, will obtain cbc/bmp/delta trop/cxr/ekg Doubt pe given no sob, no continuing pain, normal vitals.  Doubt dissection given pain was fleeting. Feel acs is also unlikely given fleeting chest pain.  Given very atypical chest pain feel that delta trop sufficient for evaluating for acs.  If  Delta trop neg, will d/c with pcp f/u  Delta trop neg, pt continues to be chest pain free.  I have discussed the results, Dx and Tx plan with the pt. They expressed understanding and agree with the plan and were told to return to ED with any worsening of condition or concern.    Disposition: Discharge  Condition: Good  Discharge Medication List as of 03/14/2015  8:45 PM      Follow Up: Antony Contras, MD [place] [id number] [phone]  In 2 days    Pt seen in conjunction with Dr. Johnney Ou, MD 03/15/15 0149  Elnora Morrison, MD 03/20/15 [phone]

## 2015-03-14 NOTE — ED Notes (Signed)
Pt reports traveling in car today and two episodes back to back of mid chest tightness. Denies sob. Pain free pta. ekg done at triage.

## 2015-03-14 NOTE — Discharge Instructions (Signed)

## 2015-06-22 ENCOUNTER — Other Ambulatory Visit: Payer: Self-pay | Admitting: Gastroenterology

## 2015-06-22 DIAGNOSIS — D125 Benign neoplasm of sigmoid colon: Secondary | ICD-10-CM | POA: Diagnosis not present

## 2015-06-22 DIAGNOSIS — D122 Benign neoplasm of ascending colon: Secondary | ICD-10-CM | POA: Diagnosis not present

## 2015-06-22 DIAGNOSIS — D126 Benign neoplasm of colon, unspecified: Secondary | ICD-10-CM | POA: Diagnosis not present

## 2015-06-22 DIAGNOSIS — Z1211 Encounter for screening for malignant neoplasm of colon: Secondary | ICD-10-CM | POA: Diagnosis not present

## 2015-06-22 DIAGNOSIS — K5730 Diverticulosis of large intestine without perforation or abscess without bleeding: Secondary | ICD-10-CM | POA: Diagnosis not present

## 2015-09-01 IMAGING — CR DG CHEST 2V
2 series · 2 of 2 positions shown · non-contrast
Comparison: 09/13/2011

CLINICAL DATA: Chest pain and lightheadedness.

EXAM:
CHEST - 2 VIEW

[w chest pa]
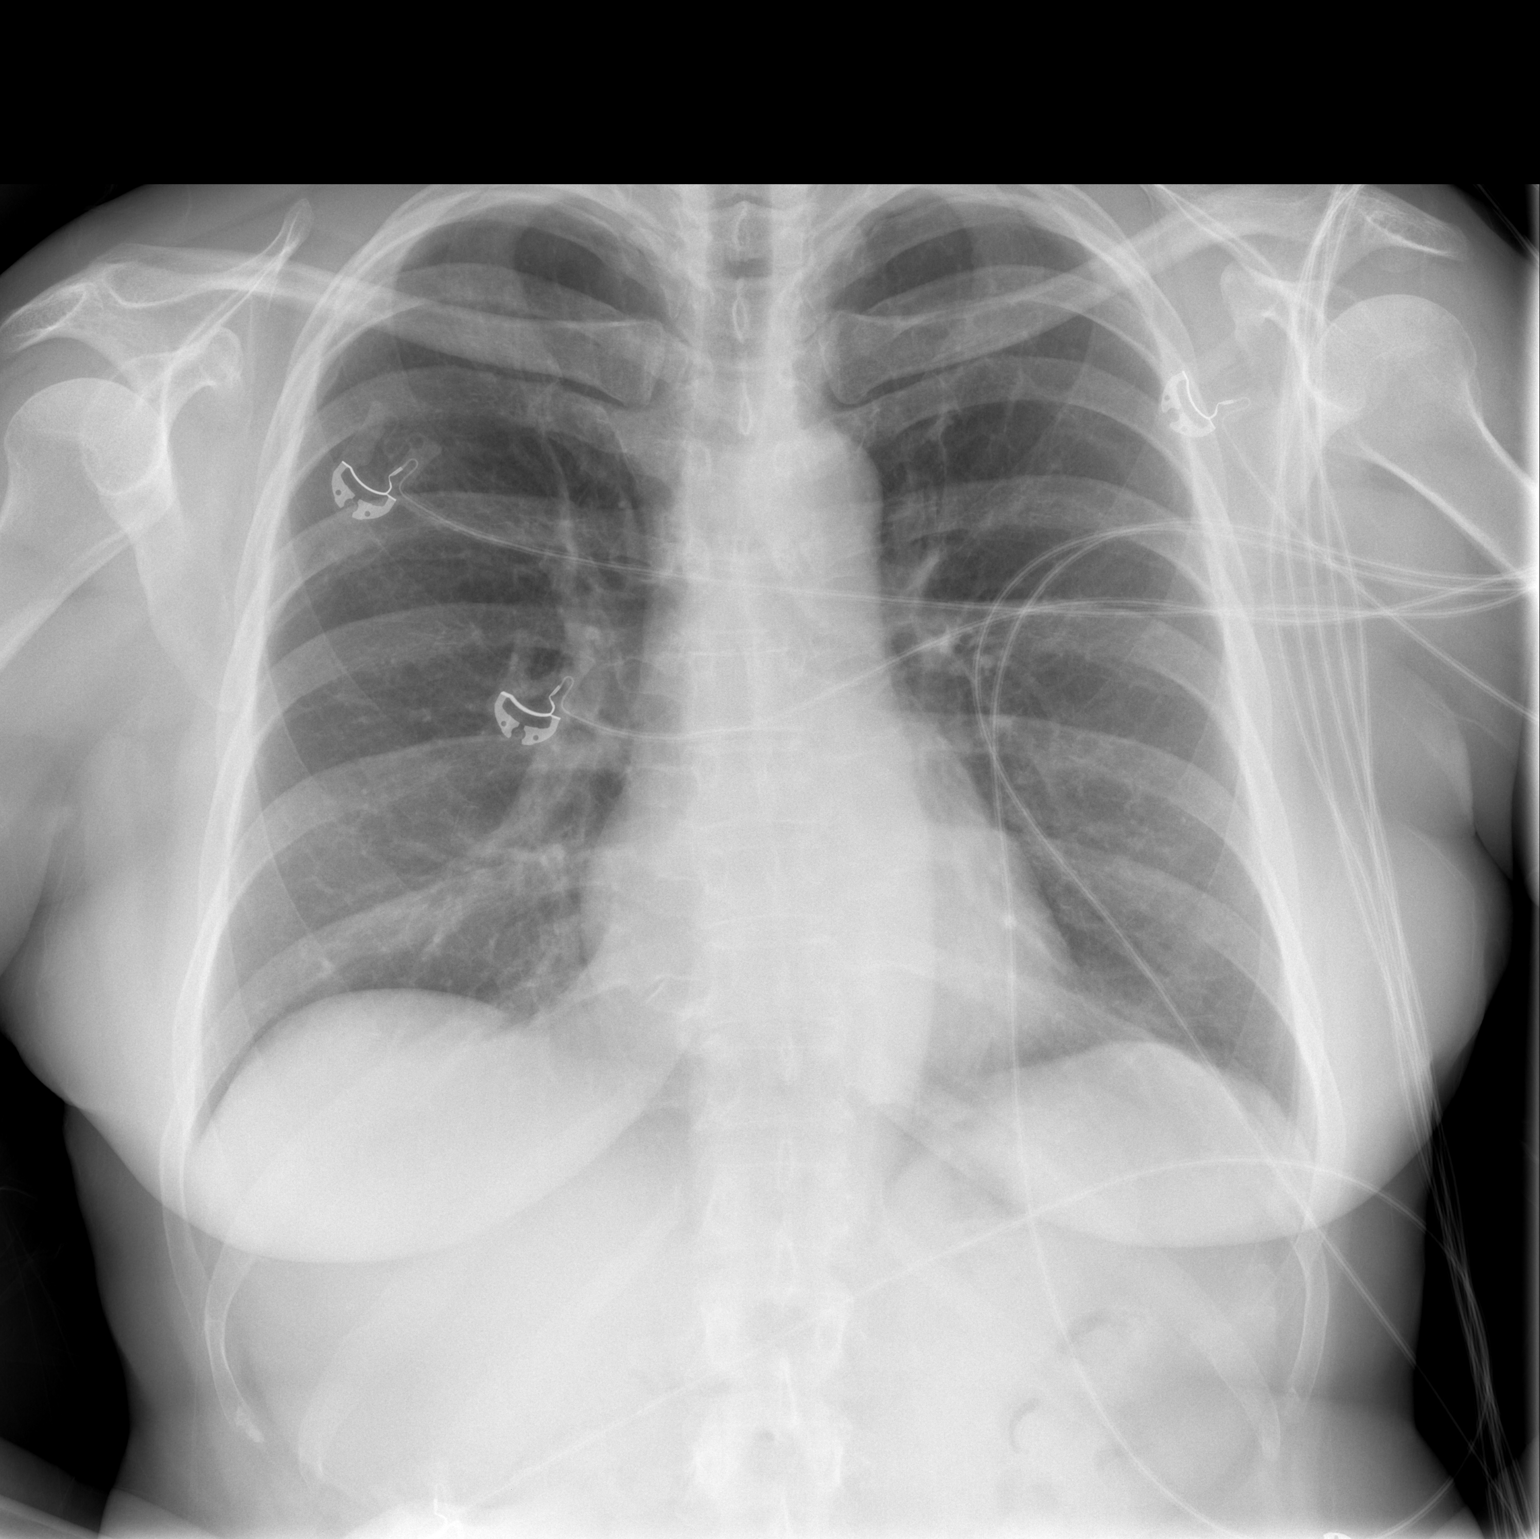

[w chest lat]
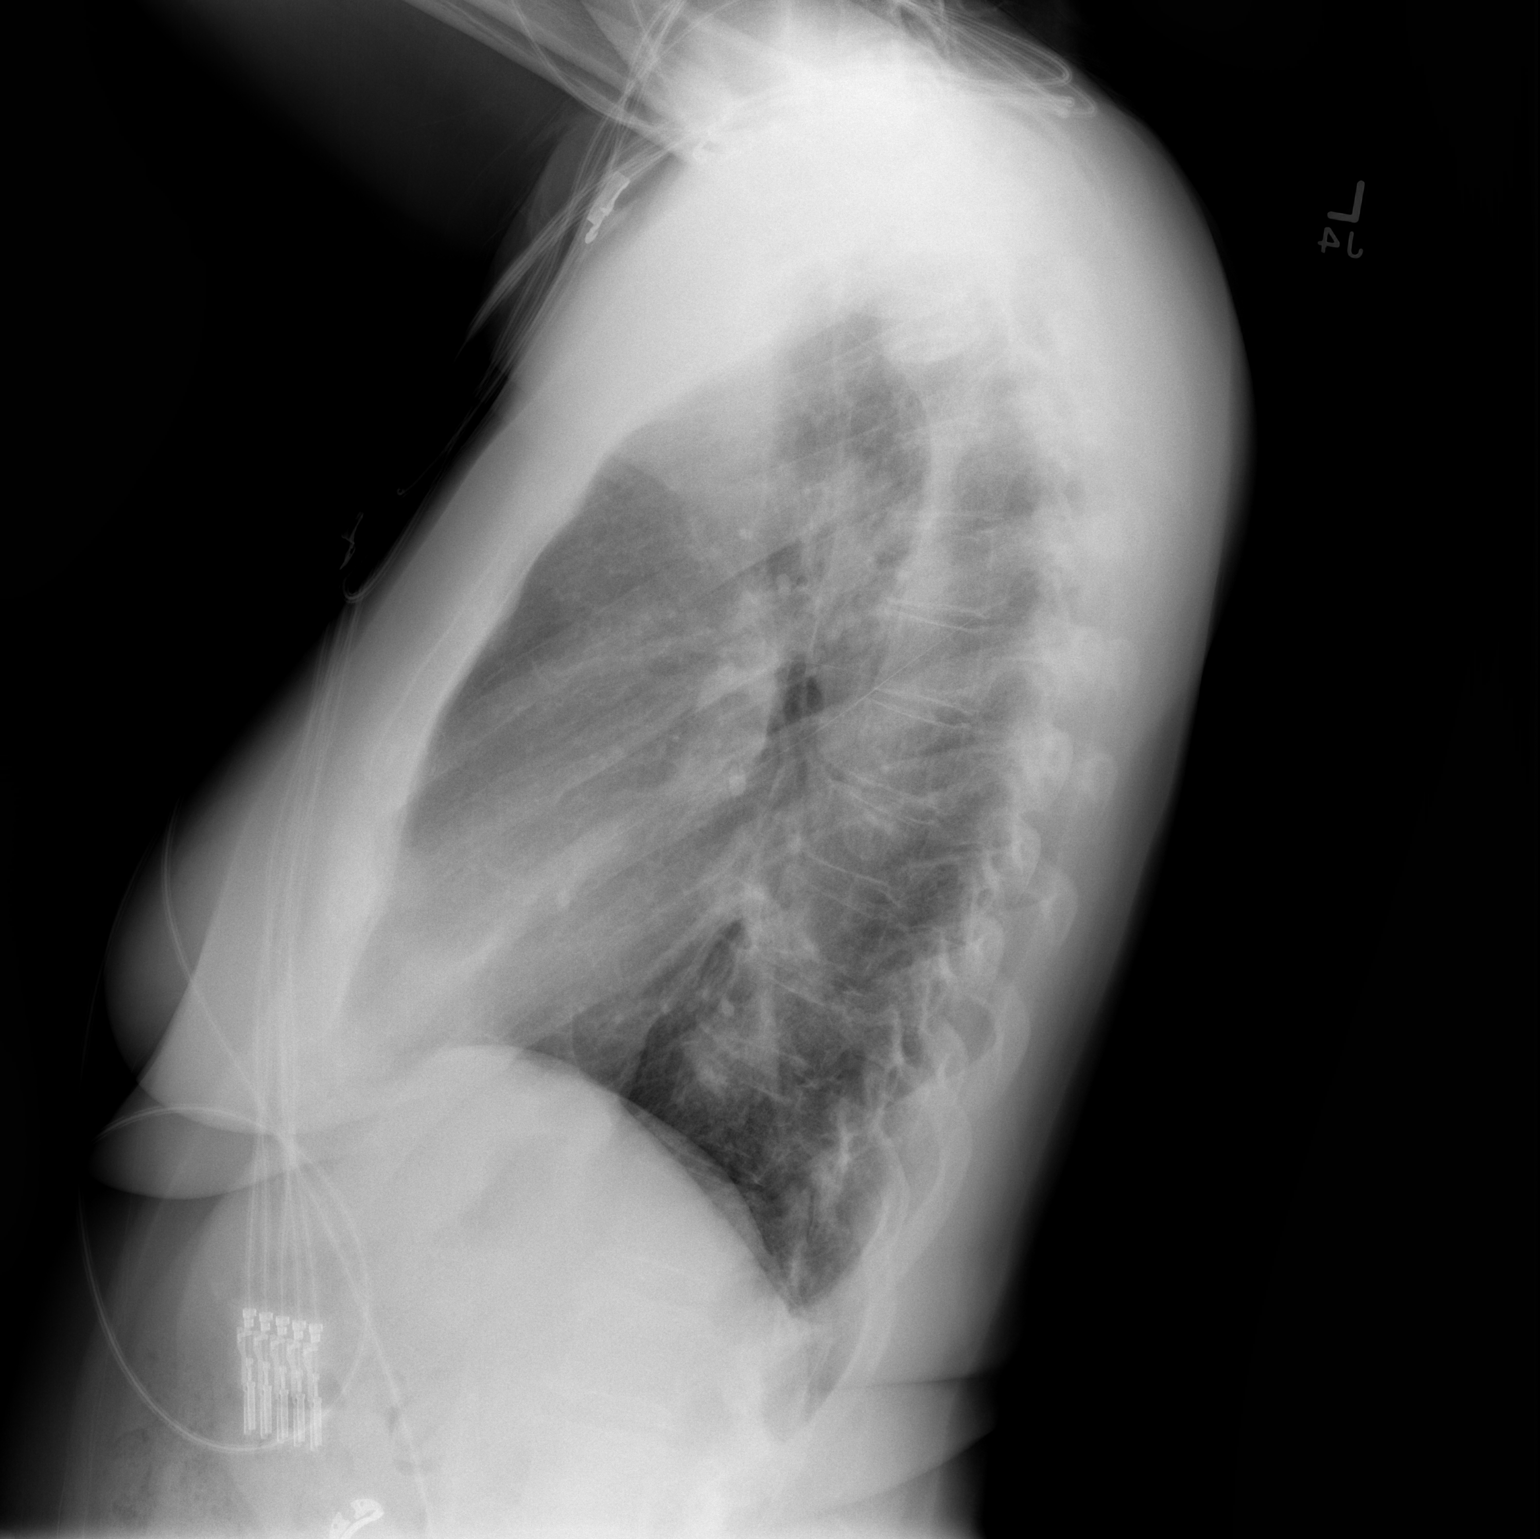

[2 of 2 positions shown; findings below may reference images not displayed]

FINDINGS: The heart size and mediastinal contours are within normal limits.
There is no evidence of pulmonary edema, consolidation,
pneumothorax, nodule or pleural fluid. The visualized skeletal
structures are unremarkable.
IMPRESSION: No active disease.

## 2016-01-04 DIAGNOSIS — Z01419 Encounter for gynecological examination (general) (routine) without abnormal findings: Secondary | ICD-10-CM | POA: Diagnosis not present

## 2016-01-04 DIAGNOSIS — Z6826 Body mass index (BMI) 26.0-26.9, adult: Secondary | ICD-10-CM | POA: Diagnosis not present

## 2016-01-10 ENCOUNTER — Other Ambulatory Visit: Payer: Self-pay | Admitting: Obstetrics & Gynecology

## 2016-01-10 DIAGNOSIS — E2839 Other primary ovarian failure: Secondary | ICD-10-CM

## 2016-02-16 ENCOUNTER — Ambulatory Visit
Admission: RE | Admit: 2016-02-16 | Discharge: 2016-02-16 | Disposition: A | Discharge status: Home / Self Care | Payer: BLUE CROSS/BLUE SHIELD | Source: Ambulatory Visit | Attending: Obstetrics & Gynecology | Admitting: Obstetrics & Gynecology

## 2016-02-16 DIAGNOSIS — E2839 Other primary ovarian failure: Secondary | ICD-10-CM

## 2016-02-16 DIAGNOSIS — Z780 Asymptomatic menopausal state: Secondary | ICD-10-CM | POA: Diagnosis not present

## 2016-02-16 DIAGNOSIS — Z13820 Encounter for screening for osteoporosis: Secondary | ICD-10-CM | POA: Diagnosis not present

## 2016-04-06 DIAGNOSIS — J439 Emphysema, unspecified: Secondary | ICD-10-CM | POA: Diagnosis not present

## 2016-04-06 DIAGNOSIS — F419 Anxiety disorder, unspecified: Secondary | ICD-10-CM | POA: Diagnosis not present

## 2016-04-06 DIAGNOSIS — I341 Nonrheumatic mitral (valve) prolapse: Secondary | ICD-10-CM | POA: Diagnosis not present

## 2016-04-06 DIAGNOSIS — E782 Mixed hyperlipidemia: Secondary | ICD-10-CM | POA: Diagnosis not present

## 2016-04-06 DIAGNOSIS — Z0000 Encounter for general adult medical examination without abnormal findings: Secondary | ICD-10-CM | POA: Diagnosis not present

## 2016-05-16 ENCOUNTER — Encounter: Payer: Self-pay | Admitting: Interventional Cardiology

## 2016-05-17 ENCOUNTER — Ambulatory Visit: Payer: BLUE CROSS/BLUE SHIELD | Admitting: Interventional Cardiology

## 2016-06-01 ENCOUNTER — Ambulatory Visit (INDEPENDENT_AMBULATORY_CARE_PROVIDER_SITE_OTHER): Payer: BLUE CROSS/BLUE SHIELD | Admitting: Interventional Cardiology

## 2016-06-01 ENCOUNTER — Encounter: Payer: Self-pay | Admitting: Interventional Cardiology

## 2016-06-01 VITALS — BP 121/80 | HR 79 | Ht 65.5 in | Wt 155.8 lb

## 2016-06-01 DIAGNOSIS — Z720 Tobacco use: Secondary | ICD-10-CM

## 2016-06-01 DIAGNOSIS — I340 Nonrheumatic mitral (valve) insufficiency: Secondary | ICD-10-CM

## 2016-06-01 DIAGNOSIS — R1900 Intra-abdominal and pelvic swelling, mass and lump, unspecified site: Secondary | ICD-10-CM

## 2016-06-01 NOTE — Progress Notes (Signed)
Cardiology Office Note   Date:  06/01/2016   ID:  ALBIRTHA GRINAGE, DOB 1955-06-17, MRN [id number]  PCP:  Gara Kroner, MD    No chief complaint on file. f/u mitral valve prolapse   Wt Readings from Last 3 Encounters:  06/01/16 155 lb 12.8 oz (70.7 kg)  03/14/15 154 lb (69.9 kg)  12/22/13 158 lb 1.9 oz (71.7 kg)       History of Present Illness: Courtney Hernandez is a 60 y.o. female who is being seen today for the evaluation of mitral valve prolapse at the request of Antony Contras, MD.  She has a h/o MVP dating back many years.  Looking back at 2013 echo, she had a thickened mitral valve with mild mitral regurgitation.  Normal LV function.   She has a h/o anxiety managed with Dr. Moreen Fowler.  Not exercising much.  SHe describes herself as "lazy."  She is tired after work.  SHe is busy on the weekends.   No CP, SHOB, palpitations.  She continues to smoke, decreased down to 0.5 ppd.    Past Medical History:  Diagnosis Date  . Anxiety   . Combined hyperlipidemia   . Emphysema lung ([facility])   . Hot flashes   . Low back pain   . Mixed hyperlipidemia   . MVP (mitral valve prolapse)   . Tobacco abuse     Past Surgical History:  Procedure Laterality Date  . BILATERAL TUBAL LIGATION Bilateral   . COLONOSCOPY    . UTERINE POLYPECTOMY       Current Outpatient Prescriptions  Medication Sig Dispense Refill  . aspirin 81 MG tablet Take 81 mg by mouth daily.    Marland Kitchen atorvastatin (LIPITOR) 40 MG tablet Take 40 mg by mouth daily.    . clonazePAM (KLONOPIN) 0.5 MG tablet Take 0.25 mg by mouth 2 (two) times daily as needed.   2   No current facility-administered medications for this visit.     Allergies:   Chantix [varenicline]    Social History:  The patient  reports that she has been smoking.  She has never used smokeless tobacco. She reports that she drinks alcohol. She reports that she does not use drugs.   Family History:  The patient's family history includes  Cancer in her maternal grandmother, sister, and sister; Heart attack in her father.    ROS:  Please see the history of present illness.   Otherwise, review of systems are positive for anxiety- treated with Xanax.   All other systems are reviewed and negative.    PHYSICAL EXAM: VS:  BP 121/80 (BP Location: Right Arm)   Pulse 79   Ht 5' 5.5" (1.664 m)   Wt 155 lb 12.8 oz (70.7 kg)   BMI 25.53 kg/m  , BMI Body mass index is 25.53 kg/m. GEN: Well nourished, well developed, in no acute distress  HEENT: normal  Neck: no JVD, carotid bruits, or masses Cardiac: RRR; no murmurs, rubs, or gallops,no edema ; normal cardiac exam supine and sitting Respiratory:  clear to auscultation bilaterally, normal work of breathing GI: soft, nontender, nondistended, + BS; prominent aortic pulsation MS: no deformity or atrophy  Skin: warm and dry, no rash Neuro:  Strength and sensation are intact Psych: euthymic mood, full affect   EKG:   The ekg ordered today demonstrates NSR, no ST changes   Recent Labs: No results found for requested labs within last 8760 hours.   Lipid Panel No results found  for: CHOL, TRIG, HDL, CHOLHDL, VLDL, LDLCALC, LDLDIRECT   Other studies Reviewed: Additional studies/ records that were reviewed today with results demonstrating: 2013 echo reviewed.   ASSESSMENT AND PLAN:  1. MVP: noted in the past with only mild MR.  No murmur on exam today.  WOuld not repeat echo at this time.  2. Pulsatile abdominal aorta: Given smoking hisotiry, would check for AAA with u/s. 3. Palpitations: rare.  Only when she has some anxiety. 4. Smoking: I encouraged her to stop smoking.  SHe will try to do this on her own.    Current medicines are reviewed at length with the patient today.  The patient concerns regarding her medicines were addressed.  The following changes have been made:  No change  Labs/ tests ordered today include:  No orders of the defined types were placed in this  encounter.   Recommend 150 minutes/week of aerobic exercise Low fat, low carb, high fiber diet recommended  Disposition:   FU in prn, based on aortic u/s result   Signed, Larae Grooms, MD  06/01/2016 [facility] [place], [place], [place]  [address] Phone: [phone]; Fax: [phone]

## 2016-06-01 NOTE — Patient Instructions (Addendum)
Medication Instructions:  Your physician recommends that you continue on your current medications as directed. Please refer to the Current Medication list given to you today.   Labwork: None ordered  Testing/Procedures: Your physician has requested that you have an abdominal aorta duplex. During this test, an ultrasound is used to evaluate the aorta. Allow 30 minutes for this exam. Do not eat after midnight the day before and avoid carbonated beverages   Follow-Up: As needed  Any Other Special Instructions Will Be Listed Below (If Applicable).     If you need a refill on your cardiac medications before your next appointment, please call your pharmacy.

## 2016-06-25 DIAGNOSIS — J181 Lobar pneumonia, unspecified organism: Secondary | ICD-10-CM | POA: Diagnosis not present

## 2016-06-27 DIAGNOSIS — B9789 Other viral agents as the cause of diseases classified elsewhere: Secondary | ICD-10-CM | POA: Diagnosis not present

## 2016-06-27 DIAGNOSIS — J069 Acute upper respiratory infection, unspecified: Secondary | ICD-10-CM | POA: Diagnosis not present

## 2016-06-30 ENCOUNTER — Inpatient Hospital Stay (HOSPITAL_COMMUNITY): Admission: RE | Admit: 2016-06-30 | Payer: BLUE CROSS/BLUE SHIELD | Source: Ambulatory Visit

## 2016-07-06 ENCOUNTER — Other Ambulatory Visit (HOSPITAL_COMMUNITY): Payer: BLUE CROSS/BLUE SHIELD

## 2016-07-25 ENCOUNTER — Ambulatory Visit (HOSPITAL_COMMUNITY)
Admission: RE | Admit: 2016-07-25 | Discharge: 2016-07-25 | Disposition: A | Discharge status: Home / Self Care | Payer: BLUE CROSS/BLUE SHIELD | Source: Ambulatory Visit | Attending: Cardiovascular Disease | Admitting: Cardiovascular Disease

## 2016-07-25 DIAGNOSIS — F17210 Nicotine dependence, cigarettes, uncomplicated: Secondary | ICD-10-CM | POA: Insufficient documentation

## 2016-07-25 DIAGNOSIS — E785 Hyperlipidemia, unspecified: Secondary | ICD-10-CM | POA: Diagnosis not present

## 2016-07-25 DIAGNOSIS — R1900 Intra-abdominal and pelvic swelling, mass and lump, unspecified site: Secondary | ICD-10-CM

## 2016-07-25 DIAGNOSIS — I700 Atherosclerosis of aorta: Secondary | ICD-10-CM | POA: Diagnosis not present

## 2017-01-11 DIAGNOSIS — Z1231 Encounter for screening mammogram for malignant neoplasm of breast: Secondary | ICD-10-CM | POA: Diagnosis not present

## 2017-01-14 IMAGING — DX DG CHEST 2V
2 series · 2 of 2 positions shown · non-contrast
Comparison: 10/29/2013

CLINICAL DATA: Mid sternal chest pain twice today.

EXAM:
CHEST  2 VIEW

[chest pa]
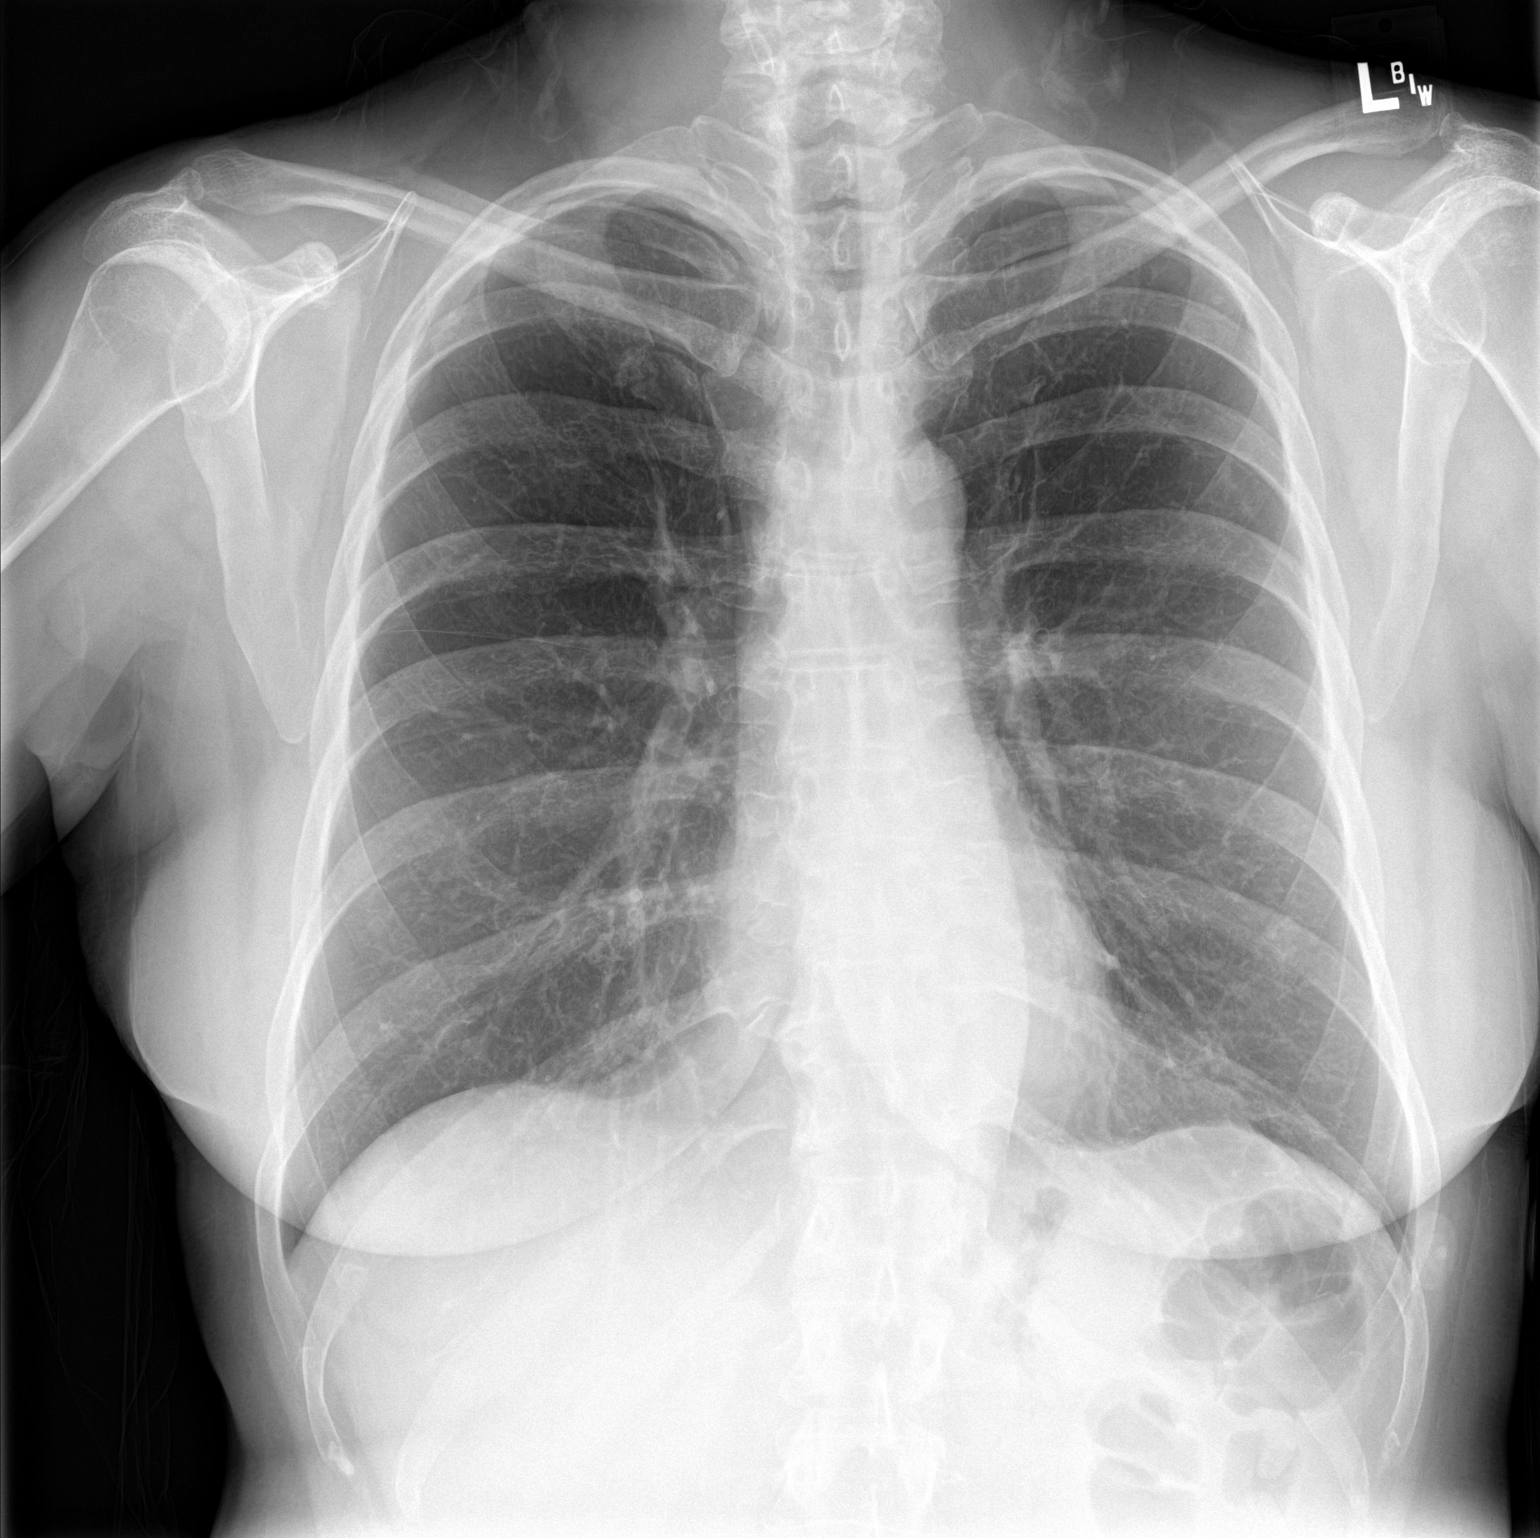

[chest lat]
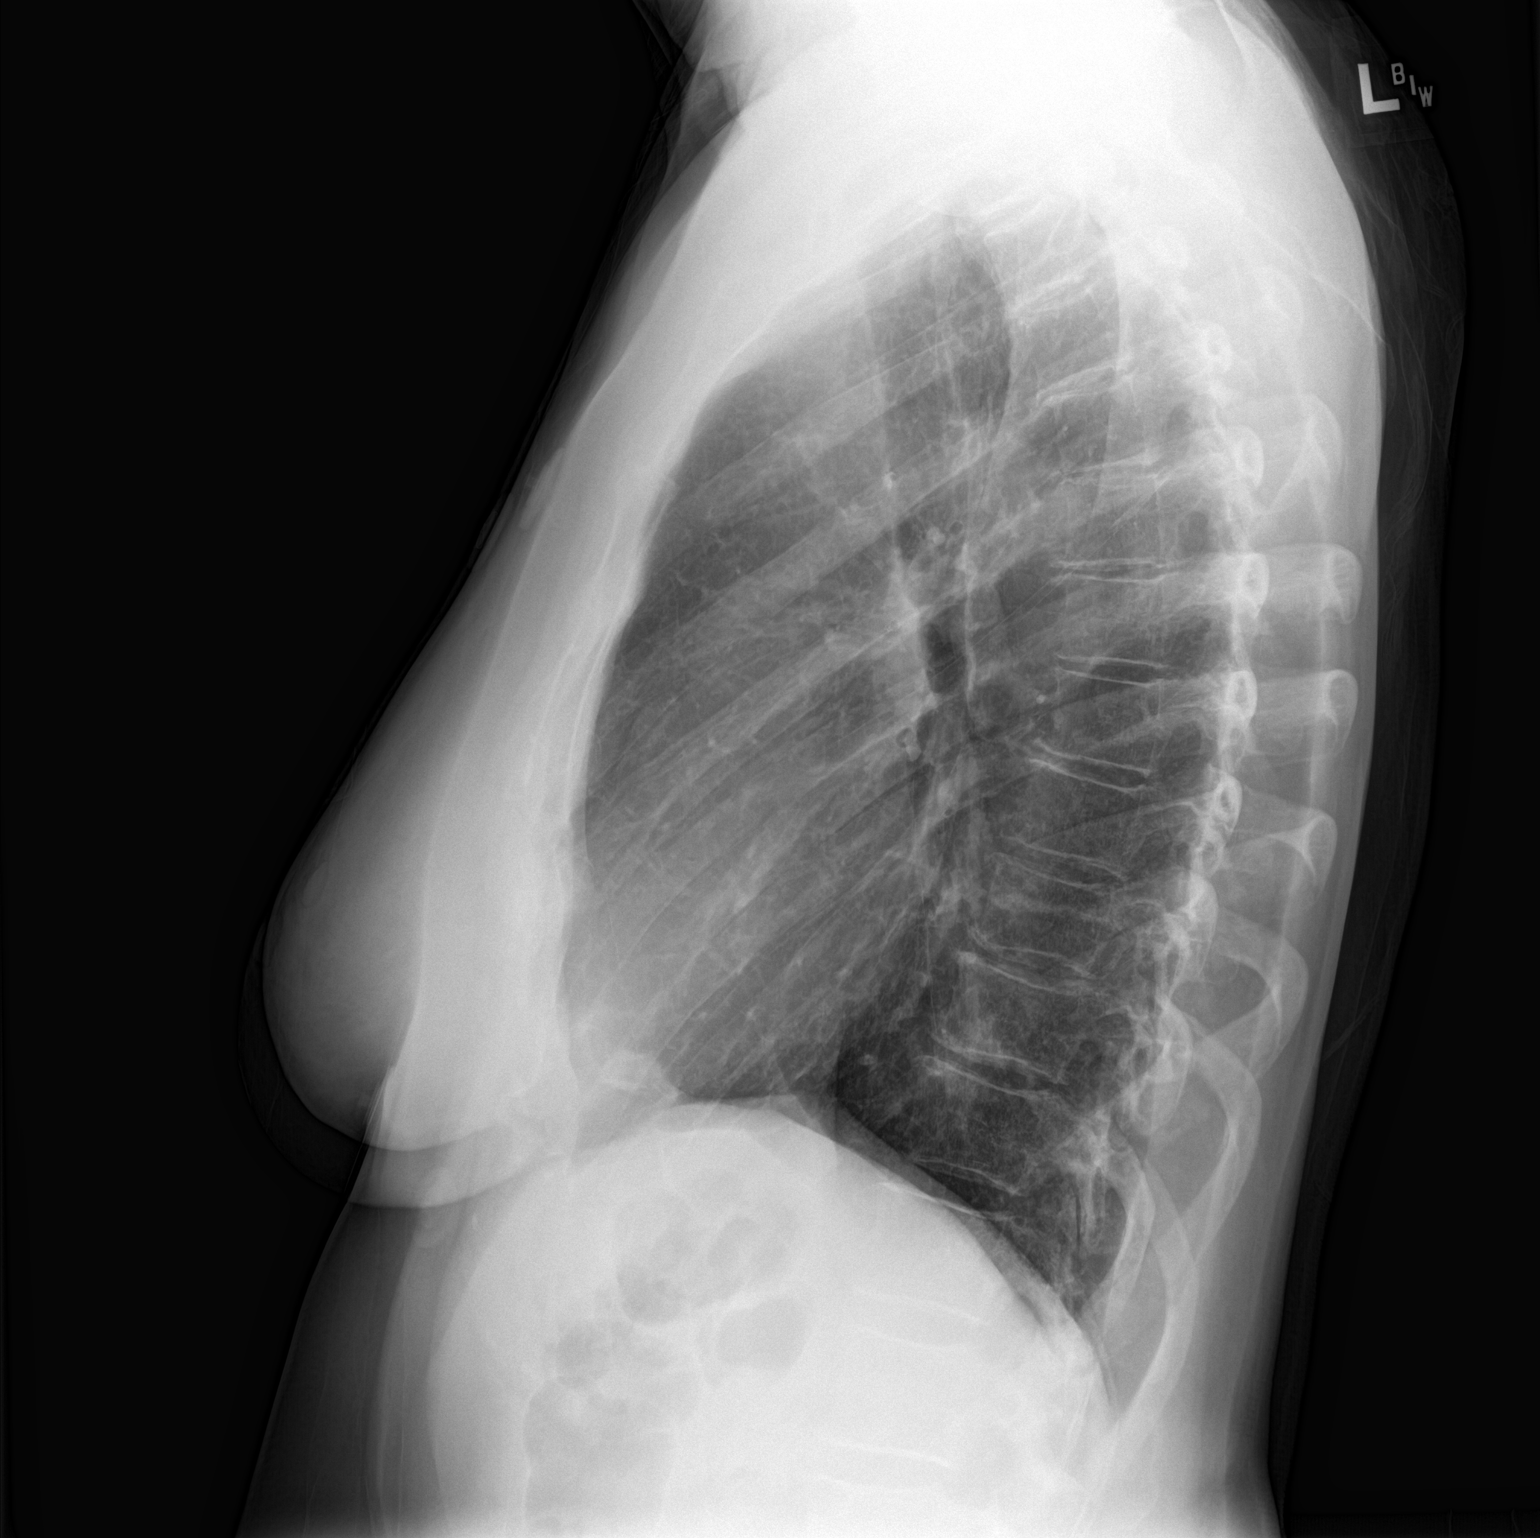

[2 of 2 positions shown; findings below may reference images not displayed]

FINDINGS: Tapering of the peripheral pulmonary vasculature favors emphysema.

The lungs appear otherwise clear. Cardiac and mediastinal margins
appear normal. No pleural effusion.
IMPRESSION: 1. Emphysema.  Otherwise, no significant abnormalities are observed.

## 2017-02-26 DIAGNOSIS — R42 Dizziness and giddiness: Secondary | ICD-10-CM | POA: Diagnosis not present

## 2017-02-27 DIAGNOSIS — R42 Dizziness and giddiness: Secondary | ICD-10-CM | POA: Diagnosis not present

## 2017-03-09 DIAGNOSIS — Z01419 Encounter for gynecological examination (general) (routine) without abnormal findings: Secondary | ICD-10-CM | POA: Diagnosis not present

## 2017-03-09 DIAGNOSIS — Z6824 Body mass index (BMI) 24.0-24.9, adult: Secondary | ICD-10-CM | POA: Diagnosis not present

## 2017-05-02 DIAGNOSIS — M25531 Pain in right wrist: Secondary | ICD-10-CM | POA: Diagnosis not present

## 2017-05-02 DIAGNOSIS — F419 Anxiety disorder, unspecified: Secondary | ICD-10-CM | POA: Diagnosis not present

## 2017-05-02 DIAGNOSIS — E782 Mixed hyperlipidemia: Secondary | ICD-10-CM | POA: Diagnosis not present

## 2017-05-02 DIAGNOSIS — Z0000 Encounter for general adult medical examination without abnormal findings: Secondary | ICD-10-CM | POA: Diagnosis not present

## 2017-05-02 DIAGNOSIS — Z1159 Encounter for screening for other viral diseases: Secondary | ICD-10-CM | POA: Diagnosis not present

## 2017-05-02 DIAGNOSIS — R232 Flushing: Secondary | ICD-10-CM | POA: Diagnosis not present

## 2017-08-02 DIAGNOSIS — E782 Mixed hyperlipidemia: Secondary | ICD-10-CM | POA: Diagnosis not present

## 2017-08-02 DIAGNOSIS — M25551 Pain in right hip: Secondary | ICD-10-CM | POA: Diagnosis not present

## 2017-09-11 DIAGNOSIS — M25551 Pain in right hip: Secondary | ICD-10-CM | POA: Diagnosis not present

## 2018-03-11 DIAGNOSIS — Z124 Encounter for screening for malignant neoplasm of cervix: Secondary | ICD-10-CM | POA: Diagnosis not present

## 2018-03-11 DIAGNOSIS — Z1231 Encounter for screening mammogram for malignant neoplasm of breast: Secondary | ICD-10-CM | POA: Diagnosis not present

## 2018-03-11 DIAGNOSIS — Z01419 Encounter for gynecological examination (general) (routine) without abnormal findings: Secondary | ICD-10-CM | POA: Diagnosis not present

## 2018-03-11 DIAGNOSIS — Z6825 Body mass index (BMI) 25.0-25.9, adult: Secondary | ICD-10-CM | POA: Diagnosis not present

## 2018-03-11 DIAGNOSIS — Z113 Encounter for screening for infections with a predominantly sexual mode of transmission: Secondary | ICD-10-CM | POA: Diagnosis not present

## 2018-03-11 DIAGNOSIS — Z1151 Encounter for screening for human papillomavirus (HPV): Secondary | ICD-10-CM | POA: Diagnosis not present

## 2018-05-06 DIAGNOSIS — J439 Emphysema, unspecified: Secondary | ICD-10-CM | POA: Diagnosis not present

## 2018-05-06 DIAGNOSIS — E782 Mixed hyperlipidemia: Secondary | ICD-10-CM | POA: Diagnosis not present

## 2018-05-06 DIAGNOSIS — I341 Nonrheumatic mitral (valve) prolapse: Secondary | ICD-10-CM | POA: Diagnosis not present

## 2018-05-06 DIAGNOSIS — F419 Anxiety disorder, unspecified: Secondary | ICD-10-CM | POA: Diagnosis not present

## 2018-10-16 ENCOUNTER — Emergency Department (HOSPITAL_COMMUNITY)
Admission: EM | Admit: 2018-10-16 | Discharge: 2018-10-16 | Disposition: A | Discharge status: Home / Self Care | Payer: BC Managed Care – PPO | Attending: Emergency Medicine | Admitting: Emergency Medicine

## 2018-10-16 ENCOUNTER — Other Ambulatory Visit: Payer: Self-pay

## 2018-10-16 ENCOUNTER — Telehealth: Payer: Self-pay | Admitting: *Deleted

## 2018-10-16 ENCOUNTER — Other Ambulatory Visit: Payer: Self-pay | Admitting: Cardiology

## 2018-10-16 ENCOUNTER — Emergency Department (HOSPITAL_COMMUNITY): Payer: BC Managed Care – PPO

## 2018-10-16 ENCOUNTER — Encounter (HOSPITAL_COMMUNITY): Payer: Self-pay | Admitting: Emergency Medicine

## 2018-10-16 DIAGNOSIS — R002 Palpitations: Secondary | ICD-10-CM | POA: Diagnosis not present

## 2018-10-16 DIAGNOSIS — F17210 Nicotine dependence, cigarettes, uncomplicated: Secondary | ICD-10-CM | POA: Diagnosis not present

## 2018-10-16 DIAGNOSIS — Z79899 Other long term (current) drug therapy: Secondary | ICD-10-CM | POA: Insufficient documentation

## 2018-10-16 DIAGNOSIS — R000 Tachycardia, unspecified: Secondary | ICD-10-CM | POA: Diagnosis not present

## 2018-10-16 DIAGNOSIS — Z7982 Long term (current) use of aspirin: Secondary | ICD-10-CM | POA: Diagnosis not present

## 2018-10-16 LAB — COMPREHENSIVE METABOLIC PANEL
ALT: 29 U/L (ref 0–44)
AST: 22 U/L (ref 15–41)
Albumin: 4 g/dL (ref 3.5–5.0)
Alkaline Phosphatase: 78 U/L (ref 38–126)
Anion gap: 12 (ref 5–15)
BUN: 8 mg/dL (ref 8–23)
CO2: 24 mmol/L (ref 22–32)
Calcium: 9.1 mg/dL (ref 8.9–10.3)
Chloride: 105 mmol/L (ref 98–111)
Creatinine, Ser: 0.84 mg/dL (ref 0.44–1.00)
GFR calc Af Amer: >60 mL/min (ref >60)
GFR calc non Af Amer: >60 mL/min (ref >60)
Glucose, Bld: 118 mg/dL — ABNORMAL HIGH (ref 70–99)
Potassium: 3.7 mmol/L (ref 3.5–5.1)
Sodium: 141 mmol/L (ref 135–145)
Total Bilirubin: 0.7 mg/dL (ref 0.3–1.2)
Total Protein: 6.9 g/dL (ref 6.5–8.1)

## 2018-10-16 LAB — CBC WITH DIFFERENTIAL/PLATELET
Abs Immature Granulocytes: 0.03 10*3/uL (ref 0.00–0.07)
Basophils Absolute: 0.1 10*3/uL (ref 0.0–0.1)
Basophils Relative: 1 %
Eosinophils Absolute: 0.1 10*3/uL (ref 0.0–0.5)
Eosinophils Relative: 1 %
HCT: 43.2 % (ref 36.0–46.0)
Hemoglobin: 14.5 g/dL (ref 12.0–15.0)
Immature Granulocytes: 0 %
Lymphocytes Relative: 23 %
Lymphs Abs: 1.7 10*3/uL (ref 0.7–4.0)
MCH: 30.8 pg (ref 26.0–34.0)
MCHC: 33.6 g/dL (ref 30.0–36.0)
MCV: 91.7 fL (ref 80.0–100.0)
Monocytes Absolute: 0.5 10*3/uL (ref 0.1–1.0)
Monocytes Relative: 6 %
Neutro Abs: 5.2 10*3/uL (ref 1.7–7.7)
Neutrophils Relative %: 69 %
Platelets: 258 10*3/uL (ref 150–400)
RBC: 4.71 MIL/uL (ref 3.87–5.11)
RDW: 12.6 % (ref 11.5–15.5)
WBC: 7.6 10*3/uL (ref 4.0–10.5)
nRBC: 0 % (ref 0.0–0.2)

## 2018-10-16 LAB — TSH: TSH: 1.307 u[IU]/mL (ref 0.350–4.500)

## 2018-10-16 LAB — TROPONIN I (HIGH SENSITIVITY)
Troponin I (High Sensitivity): 3 ng/L (ref <18)
Troponin I (High Sensitivity): 3 ng/L (ref <18)

## 2018-10-16 LAB — D-DIMER, QUANTITATIVE (NOT AT ARMC): D-Dimer, Quant: <0.27 ug/mL-FEU (ref 0.00–0.50)

## 2018-10-16 NOTE — Discharge Instructions (Addendum)
The cardiology clinic will arrange for you to have a heart monitor mailed to your home. They will give you instructions on use and follow up. Call clinic if you have not received anything by next week. Return to ER if any sudden changes in symptoms or new symptoms such as chest pain or breathing problems.

## 2018-10-16 NOTE — ED Notes (Signed)
Patient transported to X-ray 

## 2018-10-16 NOTE — Telephone Encounter (Signed)
[employer] to ship a 30 day cardiac event monitor to her home.  Instructions reviewed briefly as they are included in the monitor kit. 

## 2018-10-16 NOTE — ED Triage Notes (Addendum)
Patient arrives POV stating she had an episode of nausea this morning and checked her HR and it was in the 140s. Patient states her HR had been going between 90-120 past couple weeks but today was highest she has seen it. Patient describes a pressure in her left breast that started this morning, non radiating.   Patient adds that her PCP has prescribes her Xanax that has helped with her HR the past few weeks.

## 2018-10-16 NOTE — ED Provider Notes (Signed)
[facility] EMERGENCY DEPARTMENT Provider Note   CSN: [id number] Arrival date & time: 10/16/18  0746     History   Chief Complaint Chief Complaint  Patient presents with   Tachycardia    HPI Courtney Hernandez is a 63 y.o. female.     63 year old female with past medical history including anxiety, hyperlipidemia, mitral valve prolapse who presents with tachycardia.  Patient states that in the past she has had occasional episodes of heart racing sensation associated with tachycardia and her PCP has given her Xanax in the past.  She takes this very rarely, only when she has problems.  She notes that over the past week, she has had more frequent episodes of tachycardia with increased heart rate on her home pulse ox monitor.  During these episodes, she has nausea and occasionally some very mild left chest pressure.  She denies any significant pain.  No shortness of breath or leg swelling.  She denies any recent travel. No excessive caffeine or alcohol use, no drug use. No recent illness symptoms.   The history is provided by the patient.    Past Medical History:  Diagnosis Date   Anxiety    Combined hyperlipidemia    Emphysema lung ([facility])    Hot flashes    Low back pain    Mixed hyperlipidemia    MVP (mitral valve prolapse)    Tobacco abuse     Patient Active Problem List   Diagnosis Date Noted   Pulsatile abdominal mass 06/01/2016   Mitral regurgitation 12/22/2013   Tobacco abuse 12/22/2013    Past Surgical History:  Procedure Laterality Date   BILATERAL TUBAL LIGATION Bilateral    COLONOSCOPY     UTERINE POLYPECTOMY       OB History   No obstetric history on file.      Home Medications    Prior to Admission medications   Medication Sig Start Date End Date Taking? Authorizing Provider  aspirin 81 MG tablet Take 81 mg by mouth daily.    [provider]  atorvastatin (LIPITOR) 40 MG tablet Take 40 mg by mouth daily.     [provider]  clonazePAM (KLONOPIN) 0.5 MG tablet Take 0.25 mg by mouth 2 (two) times daily as needed.  11/11/13   [provider]    Family History Family History  Problem Relation Age of Onset   Heart attack Father    Cancer Sister    Cancer Maternal Grandmother    Cancer Sister     Social History Social History   Tobacco Use   Smoking status: Current Every Day Smoker   Smokeless tobacco: Never Used  Substance Use Topics   Alcohol use: Yes   Drug use: No     Allergies   Chantix [varenicline]   Review of Systems Review of Systems All other systems reviewed and are negative except that which was mentioned in HPI  .Physical Exam Updated Vital Signs BP 135/79    Pulse 94    Temp 98.4 F (36.9 C) (Oral)    Resp (!) 22    Ht 5\' 5"  (1.651 m)    Wt 68 kg    SpO2 94%    BMI 24.96 kg/m   Physical Exam Vitals signs and nursing note reviewed.  Constitutional:      General: She is not in acute distress.    Appearance: She is well-developed.  HENT:     Head: Normocephalic and atraumatic.  Eyes:  Conjunctiva/sclera: Conjunctivae normal.  Neck:     Musculoskeletal: Neck supple.  Cardiovascular:     Rate and Rhythm: Normal rate and regular rhythm.     Heart sounds: Normal heart sounds. No murmur.  Pulmonary:     Effort: Pulmonary effort is normal.     Breath sounds: Normal breath sounds.  Abdominal:     General: Bowel sounds are normal. There is no distension.     Palpations: Abdomen is soft.     Tenderness: There is no abdominal tenderness.  Skin:    General: Skin is warm and dry.  Neurological:     Mental Status: She is alert and oriented to person, place, and time.     Comments: Fluent speech  Psychiatric:        Judgment: Judgment normal.      ED Treatments / Results  Labs (all labs ordered are listed, but only abnormal results are displayed) Labs Reviewed  COMPREHENSIVE METABOLIC PANEL - Abnormal; Notable for the  following components:      Result Value   Glucose, Bld 118 (*)    All other components within normal limits  CBC WITH DIFFERENTIAL/PLATELET  TSH  D-DIMER, QUANTITATIVE (NOT AT [facility])  TROPONIN I (HIGH SENSITIVITY)  TROPONIN I (HIGH SENSITIVITY)    EKG None  Radiology Dg Chest 2 View  Result Date: 10/16/2018 CLINICAL DATA:  Tachycardia, chest pressure. EXAM: CHEST - 2 VIEW COMPARISON:  Radiographs of March 14, 2015. FINDINGS: The heart size and mediastinal contours are within normal limits. Both lungs are clear. No pneumothorax or pleural effusion is noted. The visualized skeletal structures are unremarkable. IMPRESSION: No active cardiopulmonary disease. Electronically Signed   By: Marijo Conception M.D.   On: 10/16/2018 09:00    Procedures Procedures (including critical care time)  Medications Ordered in ED Medications - No data to display   Initial Impression / Assessment and Plan / ED Course  I have reviewed the triage vital signs and the nursing notes.  Pertinent labs & imaging results that were available during my care of the patient were reviewed by me and considered in my medical decision making (see chart for details).       PT comfortable on exam, normal VS. EKG shows sinus rhythm. CXR normal. Labs show normal CBC, BMP, negative serial troponins, negative d-dimer making PE very unlikely. Sx have been intermittent for weeks, are non-exertional, and description not suggestive of angina therefore highly doubt ACS. TSH normal. I feel she would benefit from ambulatory heart monitoring therefore contacted cardiology--they will send pt [facility] and arrange for follow up. I discussed supportive measures and f/u plan and reviewed return precautions. She voiced understanding.  Final Clinical Impressions(s) / ED Diagnoses   Final diagnoses:  Palpitations    ED Discharge Orders    None       Millan Legan, Wenda Overland, MD 10/16/18 1413

## 2018-10-23 ENCOUNTER — Ambulatory Visit (INDEPENDENT_AMBULATORY_CARE_PROVIDER_SITE_OTHER): Payer: BC Managed Care – PPO

## 2018-10-23 DIAGNOSIS — R002 Palpitations: Secondary | ICD-10-CM | POA: Diagnosis not present

## 2018-11-19 ENCOUNTER — Telehealth: Payer: Self-pay

## 2018-11-19 DIAGNOSIS — R002 Palpitations: Secondary | ICD-10-CM

## 2018-11-19 NOTE — Telephone Encounter (Signed)
Spoke with the pt and she is having many episodes of heart "fluttering"... she denies dizziness, chest discomfort, sob...   She has 2 days left on her monitor... she agrees to Echo and referral to EP per Dr. Francesca Oman (DOD) recommendations.

## 2018-11-19 NOTE — Telephone Encounter (Signed)
New Message ° ° °Patient returning your call. °

## 2018-11-19 NOTE — Telephone Encounter (Signed)
Lm for pt on her mobile number and No Answer on her Home number..   Received critical results from [employer] this morning Day 28 of 30.... 5:09 am [place] time.. pt had arrhythmia and reported to [employer] that she had "fluttering" and nausea.   I showed strip to the DOD.. Dr. Meda Coffee and she says it is possibly VT... but pt needs Echo and to f/u with an EP physician.

## 2018-11-20 NOTE — Telephone Encounter (Signed)
Echo has been scheduled 10/9. Visit with Dr. Rayann Heman has been scheduled 10/19.

## 2018-11-22 ENCOUNTER — Other Ambulatory Visit: Payer: Self-pay

## 2018-11-22 ENCOUNTER — Encounter (INDEPENDENT_AMBULATORY_CARE_PROVIDER_SITE_OTHER): Payer: Self-pay

## 2018-11-22 ENCOUNTER — Ambulatory Visit (HOSPITAL_COMMUNITY): Payer: BC Managed Care – PPO | Attending: Cardiology

## 2018-11-22 DIAGNOSIS — R002 Palpitations: Secondary | ICD-10-CM | POA: Diagnosis not present

## 2018-12-02 ENCOUNTER — Telehealth (INDEPENDENT_AMBULATORY_CARE_PROVIDER_SITE_OTHER): Payer: BC Managed Care – PPO | Admitting: Internal Medicine

## 2018-12-02 ENCOUNTER — Other Ambulatory Visit: Payer: Self-pay

## 2018-12-02 VITALS — Ht 65.0 in | Wt 147.0 lb

## 2018-12-02 DIAGNOSIS — R002 Palpitations: Secondary | ICD-10-CM | POA: Diagnosis not present

## 2018-12-02 DIAGNOSIS — I472 Ventricular tachycardia: Secondary | ICD-10-CM | POA: Diagnosis not present

## 2018-12-02 DIAGNOSIS — I4729 Other ventricular tachycardia: Secondary | ICD-10-CM

## 2018-12-02 MED ORDER — NADOLOL 20 MG PO TABS: 10.0000 mg | ORAL_TABLET | Freq: Every day | ORAL | 6 refills | Status: DC

## 2018-12-02 NOTE — Progress Notes (Signed)
Electrophysiology TeleHealth Note   Due to national recommendations of social distancing due to [place] 19, Audio  telehealth visit is felt to be most appropriate for this patient at this time.  Verbal consent was obtained for the visit today.   Date:  12/02/2018   ID:  Courtney Hernandez, DOB 04/16/1955, MRN [id number]  Location: home  Provider location: [address] [place] Evaluation Performed: New patient consult  PCP:  Antony Contras, MD  Cardiologist:  Dr Irish Lack Electrophysiologist:  None   Chief Complaint:  palpitations  History of Present Illness:    Courtney Hernandez is a 63 y.o. female who presents via audio/video conferencing for a telehealth visit today.   The patient is referred for new consultation regarding palpitations by Dr Meda Coffee.  She has a history of mitral valve prolapse and anxiety.  She reports having "heart racing" and "fluttering" for several months.  Episodes have increased recently.  She presented to the ED and was found to have sinus rhythm.  She had an event monitor placed.  This has documented   She reports occasional palpations behind her left breast.  She does not feel that her heart rate is fast, but it is "hard".  She has taken xanax for this.    Today, she denies symptoms of exertional chest pain, shortness of breath, orthopnea, PND, lower extremity edema, claudication, dizziness, presyncope, syncope, bleeding, or neurologic sequela. The patient is tolerating medications without difficulties and is otherwise without complaint today.   she denies symptoms of cough, fevers, chills, or new SOB worrisome for COVID 19.   Past Medical History:  Diagnosis Date  . Anxiety   . Combined hyperlipidemia   . Emphysema lung ([facility])   . Hot flashes   . Low back pain   . Mixed hyperlipidemia   . MVP (mitral valve prolapse)   . Tobacco abuse     Past Surgical History:  Procedure Laterality Date  . BILATERAL TUBAL LIGATION Bilateral   .  COLONOSCOPY    . UTERINE POLYPECTOMY      Current Outpatient Medications  Medication Sig Dispense Refill  . aspirin 81 MG tablet Take 81 mg by mouth daily.    Marland Kitchen atorvastatin (LIPITOR) 40 MG tablet Take 40 mg by mouth daily.    . clonazePAM (KLONOPIN) 0.5 MG tablet Take 0.25 mg by mouth 2 (two) times daily as needed.   2   No current facility-administered medications for this visit.     Allergies:   Chantix [varenicline]   Social History:  The patient  reports that she has been smoking. She has never used smokeless tobacco. She reports current alcohol use. She reports that she does not use drugs.   Family History:  The patient's family history includes Cancer in her maternal grandmother, sister, and sister; Heart attack in her father.    ROS:  Please see the history of present illness.   All other systems are personally reviewed and negative.    Exam:    Vital Signs:  Ht 5\' 5"  (1.651 m)   Wt 147 lb (66.7 kg)   BMI 24.46 kg/m    Well sounding , alert and conversant   Labs/Other Tests and Data Reviewed:    Recent Labs: 10/16/2018: ALT 29; BUN 8; Creatinine, Ser 0.84; Hemoglobin 14.5; Platelets 258; Potassium 3.7; Sodium 141; TSH 1.307   Wt Readings from Last 3 Encounters:  12/02/18 147 lb (66.7 kg)  10/16/18 150 lb (68 kg)  06/01/16 155 lb 12.8 oz (70.7 kg)     Other studies personally reviewed: Additional studies/ records that were reviewed today include: recent ER visit,  Dr Hassell Done notes,  Dr Thera Flake phone recores  Review of the above records today demonstrates: as above    Echo 11/22/2018- EF 55-60%, RV size/ function was normal, normal valve function  Event monitor 11/2018 is reviewed which revealed only sinus rhythm during the majority of her episodes.  She did have NSVT 11/19/2018 at 6:09 am  ASSESSMENT & PLAN:    1.  palpitations The majority of episodes appear to correlate to sinus rhythm.  She did have NSVT on event monitor x 1.  She has a structurally  normal heart.  I would therefore advise nadolol 10mg  daily as an option. She should also take her BP when she notices her symptoms.  It should more like elevation in BP than an arrhythmia.  I have discouraged the use of xanax for this indication given its addictive properties.  No further workup is planned  Return to see EP PA in 6-8 weeks   Current medicines are reviewed at length with the patient today.   The patient does not have concerns regarding her medicines.  The following changes were made today:  none  Labs/ tests ordered today include:  No orders of the defined types were placed in this encounter.   Patient Risk:  after full review of this patients clinical status, I feel that they are at moderate risk at this time.   Today, I have spent 20 minutes with the patient with telehealth technology discussing palpitations .    Signed, Thompson Grayer MD, [place] 12/02/2018 3:29 PM   [place] [address] [phone] (office) [phone] (fax)

## 2019-01-13 NOTE — Progress Notes (Deleted)
Cardiology Office Note Date:  01/13/2019  Patient ID:  Courtney Hernandez 06-15-1955, MRN [id number] PCP:  Antony Contras, MD  Cardiologist:  Dr. Irish Lack Electrophysiologist: Dr. Rayann Heman  ***refresh   Chief Complaint: *** planned f/u  History of Present Illness: Courtney Hernandez is a 63 y.o. female with history of MVP, palpitations historically associated with anxiety.  She was referred to Dr. Irish Lack April 2018 for h/o MVP, palpitations associated with anxiety.  Review of prior echo noted only mild MR, no new w/u was recommended, planned for [place] to evaluate pulsatile abd AO (was negative), and urged to quit smoking.  She had an ER visit  Sept 2020 with increased frequency of tachycardia/palpitations associated with nausea, vague chest dicomfort.  Labs were unremarkable, EKG described as SR and in d/w cardiology service [planned for outpt monitoring  Monitor noted episode that DOD felt possibly was VT, recommended echo and EP consult   She comes in today to be seen for Dr. Rayann Heman. Last seen by him via virtual visit 10/19//2020.  He noted  Echo 11/22/2018- EF 55-60%, RV size/ function was normal, normal valve function Event monitor 11/2018 is reviewed which revealed only sinus rhythm during the majority of her episodes.  She did have NSVT 11/19/2018 at 6:09 am The majority of episodes appear to correlate to sinus rhythm.  She did have NSVT on event monitor x 1.  Given a structurally normal heart, recommended nadolol 10mg  daily  Urged/advised against the use of xanax for her palpitations, given addictive properties, no additional w/u was recommended or planned He suspected high BP as a possible etiology of her feeling her heart beating hard, and encouraged her to check her BP with symptoms. To have 6-8 week follow up.  *** symptoms, ?BP *** nadolol *** any h/o syncope??   Past Medical History:  Diagnosis Date  . Anxiety   . Combined hyperlipidemia   . Emphysema lung ([place])    . Hot flashes   . Low back pain   . Mixed hyperlipidemia   . MVP (mitral valve prolapse)   . Tobacco abuse     Past Surgical History:  Procedure Laterality Date  . BILATERAL TUBAL LIGATION Bilateral   . COLONOSCOPY    . UTERINE POLYPECTOMY      Current Outpatient Medications  Medication Sig Dispense Refill  . aspirin 81 MG tablet Take 81 mg by mouth daily.    Marland Kitchen atorvastatin (LIPITOR) 40 MG tablet Take 40 mg by mouth daily.    . clonazePAM (KLONOPIN) 0.5 MG tablet Take 0.25 mg by mouth 2 (two) times daily as needed.   2  . nadolol (CORGARD) 20 MG tablet Take 0.5 tablets (10 mg total) by mouth daily. 30 tablet 6   No current facility-administered medications for this visit.     Allergies:   Chantix [varenicline]   Social History:  The patient  reports that she has been smoking. She has never used smokeless tobacco. She reports current alcohol use. She reports that she does not use drugs.   Family History:  The patient's family history includes Cancer in her maternal grandmother, sister, and sister; Heart attack in her father.  ROS:  Please see the history of present illness.  All other systems are reviewed and otherwise negative.   PHYSICAL EXAM: *** VS:  There were no vitals taken for this visit. BMI: There is no height or weight on file to calculate BMI. Well nourished, well developed, in no acute distress  HEENT:  normocephalic, atraumatic  Neck: no JVD, carotid bruits or masses Cardiac:  *** RRR; no significant murmurs, no rubs, or gallops Lungs:  *** CTA b/l, no wheezing, rhonchi or rales  Abd: soft, nontender MS: no deformity or *** atrophy Ext: *** no edema  Skin: warm and dry, no rash Neuro:  No gross deficits appreciated Psych: euthymic mood, full affect     EKG:  Not done today   30 day monitor, oct 2020  Normal sinus rhythm with occasional PVCs.  No pathologic arrhythmias.  11/22/2018 TTE IMPRESSIONS  1. Left ventricular ejection fraction, by  visual estimation, is 55 to 60%. The left ventricle has normal function. Normal left ventricular size. There is no left ventricular hypertrophy.  2. Global right ventricle has normal systolic function.The right ventricular size is normal. No increase in right ventricular wall thickness.  3. Left atrial size was normal.  4. Right atrial size was normal.  5. The mitral valve is normal in structure. No evidence of mitral valve regurgitation. No evidence of mitral stenosis.  6. The tricuspid valve is normal in structure. Tricuspid valve regurgitation is trivial.  7. The aortic valve is normal in structure. Aortic valve regurgitation was not visualized by color flow Doppler. Structurally normal aortic valve, with no evidence of sclerosis or stenosis.  8. The pulmonic valve was normal in structure. Pulmonic valve regurgitation is not visualized by color flow Doppler.  9. Normal pulmonary artery systolic pressure. 10. The inferior vena cava is normal in size with greater than 50% respiratory variability, suggesting right atrial pressure of 3 mmHg.     Recent Labs: 10/16/2018: ALT 29; BUN 8; Creatinine, Ser 0.84; Hemoglobin 14.5; Platelets 258; Potassium 3.7; Sodium 141; TSH 1.307  No results found for requested labs within last 8760 hours.   CrCl cannot be calculated (Patient's most recent lab result is older than the maximum 21 days allowed.).   Wt Readings from Last 3 Encounters:  12/02/18 147 lb (66.7 kg)  10/16/18 150 lb (68 kg)  06/01/16 155 lb 12.8 oz (70.7 kg)     Other studies reviewed: Additional studies/records reviewed today include: summarized above  ASSESSMENT AND PLAN:  1. Palpitations     Most noted to be associated with SR on her monitor     ONE episode of NSVT     *** nadolol      Disposition: F/u with ***  Current medicines are reviewed at length with the patient today.  The patient did not have any concerns regarding medicines.***  Venetia Night, PA-C  01/13/2019 8:39 AM     [facility] [address] [place] [address] [phone] (office)  [phone] (fax)

## 2019-01-14 ENCOUNTER — Ambulatory Visit: Payer: BC Managed Care – PPO | Admitting: Physician Assistant

## 2019-01-30 NOTE — Progress Notes (Signed)
Cardiology Office Note Date:  01/31/2019  Patient ID:  Courtney Hernandez, Courtney Hernandez Jun 04, 1955, MRN [id number] PCP:  Antony Contras, MD  Cardiologist:  Dr. Irish Lack Electrophysiologist: Dr. Rayann Heman     Chief Complaint:  planned f/u  History of Present Illness: Courtney Hernandez is a 63 y.o. female with history of MVP, palpitations historically associated with anxiety.  She was referred to Dr. Irish Lack April 2018 for h/o MVP, palpitations associated with anxiety.  Review of prior echo noted only mild MR, no new w/u was recommended, planned for [place] to evaluate pulsatile abd AO (was negative), and urged to quit smoking.  She had an ER visit  Sept 2020 with increased frequency of tachycardia/palpitations associated with nausea, vague chest dicomfort.  Labs were unremarkable, EKG described as SR and in d/w cardiology service [planned for outpt monitoring  Monitor noted episode that DOD felt possibly was VT, recommended echo and EP consult   She comes in today to be seen for Dr. Rayann Heman. Last seen by him via virtual visit 10/19//2020.  He noted  Echo 11/22/2018- EF 55-60%, RV size/ function was normal, normal valve function Event monitor 11/2018 is reviewed which revealed only sinus rhythm during the majority of her episodes.  She did have NSVT 11/19/2018 at 6:09 am The majority of episodes appear to correlate to sinus rhythm.  She did have NSVT on event monitor x 1.  Given a structurally normal heart, recommended nadolol 10mg  daily  Urged/advised against the use of xanax for her palpitations, given addictive properties, no additional w/u was recommended or planned He suspected high BP as a possible etiology of her feeling her heart beating hard, and encouraged her to check her BP with symptoms. To have 6-8 week follow up.   She is doing well, mentions that she filled and has the nadolol but has not started taking it.  Says that she has not felt the palpitations since her visit with Dr. Rayann Heman until  today and would like to avoid a daily medicine if possible.  Says she had just finished up a conference call and was on her way up, always gets a little anxious coming to doctors office and felt some palpitations.  She asks to check her feet/circulation noting that for a couple months that when she steps with her L foot she notices that the color of the top of her foot goes from reddish to white and feels a little tight.  This started a couple months ago.  Also feels like it tingles, is not numb, not "asleep" but tingles. No symptoms of claudication, she will get a cramp in her calf at night occassionally, never when abulating  She denies any other concerns.     Past Medical History:  Diagnosis Date  . Anxiety   . Combined hyperlipidemia   . Emphysema lung ([place])   . Hot flashes   . Low back pain   . Mixed hyperlipidemia   . MVP (mitral valve prolapse)   . Tobacco abuse     Past Surgical History:  Procedure Laterality Date  . BILATERAL TUBAL LIGATION Bilateral   . COLONOSCOPY    . UTERINE POLYPECTOMY      Current Outpatient Medications  Medication Sig Dispense Refill  . aspirin 81 MG tablet Take 81 mg by mouth daily.    Marland Kitchen atorvastatin (LIPITOR) 40 MG tablet Take 40 mg by mouth daily.    . clonazePAM (KLONOPIN) 0.5 MG tablet Take 0.25 mg by mouth 2 (two) times daily  as needed.   2  . nadolol (CORGARD) 20 MG tablet Take 0.5 tablets (10 mg total) by mouth daily. 30 tablet 6   No current facility-administered medications for this visit.    Allergies:   Chantix [varenicline]   Social History:  The patient  reports that she has been smoking. She has never used smokeless tobacco. She reports current alcohol use. She reports that she does not use drugs.   Family History:  The patient's family history includes Cancer in her maternal grandmother, sister, and sister; Heart attack in her father.  ROS:  Please see the history of present illness.  All other systems are reviewed and  otherwise negative.   PHYSICAL EXAM:  VS:  Ht 5\' 5"  (1.651 m)   Wt 142 lb (64.4 kg)   BMI 23.63 kg/m  BMI: Body mass index is 23.63 kg/m. Well nourished, well developed, in no acute distress  HEENT: normocephalic, atraumatic  Neck: no JVD, carotid bruits or masses Cardiac:  RRR with a few extrasystoles; no significant murmurs, no rubs, or gallops Lungs:   CTA b/l, no wheezing, rhonchi or rales  Abd: soft, nontender MS: no deformity or atrophy Ext: no edema, her feet are slightly erythematous, slightly cool,  not cyanotic, she has good PT and DP pulses, and brisk cap refill b/l Skin: warm and dry, no rash Neuro:  No gross deficits appreciated Psych: euthymic mood, full affect     EKG:  Not done today   30 day monitor, oct 2020  Normal sinus rhythm with occasional PVCs.  No pathologic arrhythmias.  11/22/2018 TTE IMPRESSIONS  1. Left ventricular ejection fraction, by visual estimation, is 55 to 60%. The left ventricle has normal function. Normal left ventricular size. There is no left ventricular hypertrophy.  2. Global right ventricle has normal systolic function.The right ventricular size is normal. No increase in right ventricular wall thickness.  3. Left atrial size was normal.  4. Right atrial size was normal.  5. The mitral valve is normal in structure. No evidence of mitral valve regurgitation. No evidence of mitral stenosis.  6. The tricuspid valve is normal in structure. Tricuspid valve regurgitation is trivial.  7. The aortic valve is normal in structure. Aortic valve regurgitation was not visualized by color flow Doppler. Structurally normal aortic valve, with no evidence of sclerosis or stenosis.  8. The pulmonic valve was normal in structure. Pulmonic valve regurgitation is not visualized by color flow Doppler.  9. Normal pulmonary artery systolic pressure. 10. The inferior vena cava is normal in size with greater than 50% respiratory variability, suggesting right  atrial pressure of 3 mmHg.     Recent Labs: 10/16/2018: ALT 29; BUN 8; Creatinine, Ser 0.84; Hemoglobin 14.5; Platelets 258; Potassium 3.7; Sodium 141; TSH 1.307  No results found for requested labs within last 8760 hours.   CrCl cannot be calculated (Patient's most recent lab result is older than the maximum 21 days allowed.).   Wt Readings from Last 3 Encounters:  01/31/19 142 lb (64.4 kg)  12/02/18 147 lb (66.7 kg)  10/16/18 150 lb (68 kg)     Other studies reviewed: Additional studies/records reviewed today include: summarized above  ASSESSMENT AND PLAN:  1. Palpitations     Most noted to be associated with SR on her monitor     ONE episode of NSVT     PVCs       A few extrasystoles appreciated on exam, she attributes to her being here. She inquired  about using the nadolol PRN.  Since she was having ectopy today, I recommended she try taking it daily first and we can see her back in a few months and reassess   2. Foot concerns     I do not appreciate any evidence of PVD, she has good pulses and brisk cap refill     ?neuropathy and recommended to discuss with her PMD     If this progressses or develops symptoms of claudication we can pursue dopplers   Disposition: as above   Current medicines are reviewed at length with the patient today.  The patient did not have any concerns regarding medicines.  Venetia Night, PA-C 01/31/2019 3:10 PM     [place] [address] [phone] (office)  [phone] (fax)

## 2019-01-31 ENCOUNTER — Ambulatory Visit (INDEPENDENT_AMBULATORY_CARE_PROVIDER_SITE_OTHER): Payer: BC Managed Care – PPO | Admitting: Physician Assistant

## 2019-01-31 ENCOUNTER — Other Ambulatory Visit: Payer: Self-pay

## 2019-01-31 VITALS — BP 144/82 | HR 88 | Ht 65.0 in | Wt 142.0 lb

## 2019-01-31 DIAGNOSIS — I493 Ventricular premature depolarization: Secondary | ICD-10-CM

## 2019-01-31 DIAGNOSIS — R002 Palpitations: Secondary | ICD-10-CM

## 2019-01-31 NOTE — Patient Instructions (Signed)
Medication Instructions:  Your physician recommends that you continue on your current medications as directed. Please refer to the Current Medication list given to you today.  *If you need a refill on your cardiac medications before your next appointment, please call your pharmacy*  Lab Work: Reader   If you have labs (blood work) drawn today and your tests are completely normal, you will receive your results only by: Marland Kitchen MyChart Message (if you have MyChart) OR . A paper copy in the mail If you have any lab test that is abnormal or we need to change your treatment, we will call you to review the results.  Testing/Procedures: NONE ORDERED  TODAY   Follow-Up: At [facility], you and your health needs are our priority.  As part of our continuing mission to provide you with exceptional heart care, we have created designated Provider Care Teams.  These Care Teams include your primary Cardiologist (physician) and Advanced Practice Providers (APPs -  Physician Assistants and Nurse Practitioners) who all work together to provide you with the care you need, when you need it.  Your next appointment:   3-4  month(s)  The format for your next appointment:   In Person  Provider:   Tommye Standard   Other Instructions

## 2019-04-18 DIAGNOSIS — J439 Emphysema, unspecified: Secondary | ICD-10-CM | POA: Diagnosis not present

## 2019-04-18 DIAGNOSIS — Z0000 Encounter for general adult medical examination without abnormal findings: Secondary | ICD-10-CM | POA: Diagnosis not present

## 2019-04-18 DIAGNOSIS — R030 Elevated blood-pressure reading, without diagnosis of hypertension: Secondary | ICD-10-CM | POA: Diagnosis not present

## 2019-04-18 DIAGNOSIS — E782 Mixed hyperlipidemia: Secondary | ICD-10-CM | POA: Diagnosis not present

## 2019-04-18 DIAGNOSIS — F419 Anxiety disorder, unspecified: Secondary | ICD-10-CM | POA: Diagnosis not present

## 2019-04-22 ENCOUNTER — Other Ambulatory Visit: Payer: Self-pay | Admitting: Family Medicine

## 2019-04-22 DIAGNOSIS — Z122 Encounter for screening for malignant neoplasm of respiratory organs: Secondary | ICD-10-CM

## 2019-05-01 ENCOUNTER — Other Ambulatory Visit: Payer: Self-pay | Admitting: *Deleted

## 2019-05-01 DIAGNOSIS — F17210 Nicotine dependence, cigarettes, uncomplicated: Secondary | ICD-10-CM

## 2019-05-16 ENCOUNTER — Ambulatory Visit: Payer: BC Managed Care – PPO

## 2019-05-26 ENCOUNTER — Ambulatory Visit (INDEPENDENT_AMBULATORY_CARE_PROVIDER_SITE_OTHER): Payer: BC Managed Care – PPO | Admitting: Acute Care

## 2019-05-26 ENCOUNTER — Encounter: Payer: Self-pay | Admitting: Acute Care

## 2019-05-26 ENCOUNTER — Ambulatory Visit
Admission: RE | Admit: 2019-05-26 | Discharge: 2019-05-26 | Disposition: A | Discharge status: Home / Self Care | Payer: BC Managed Care – PPO | Source: Ambulatory Visit | Attending: Acute Care | Admitting: Acute Care

## 2019-05-26 ENCOUNTER — Other Ambulatory Visit: Payer: Self-pay

## 2019-05-26 DIAGNOSIS — F17210 Nicotine dependence, cigarettes, uncomplicated: Secondary | ICD-10-CM | POA: Diagnosis not present

## 2019-05-26 NOTE — Patient Instructions (Signed)
Thank you for participating in the [place] [facility] Screening Program. It was our pleasure to meet you today. We will call you with the results of your scan within the next few days. Your scan will be assigned a Lung RADS category score by the physicians reading the scans.  This Lung RADS score determines follow up scanning.  See below for description of categories, and follow up screening recommendations. We will be in touch to schedule your follow up screening annually or based on recommendations of our providers. We will fax a copy of your scan results to your Primary Care Physician, or the physician who referred you to the program, to ensure they have the results. Please call the office if you have any questions or concerns regarding your scanning experience or results.  Our office number is [phone]. Please speak with Denise Phelps, RN. She is our Lung Cancer Screening RN. If she is unavailable when you call, please have the office staff send her a message. She will return your call at her earliest convenience. Remember, if your scan is normal, we will scan you annually as long as you continue to meet the criteria for the program. (Age 55-77, Current smoker or smoker who has quit within the last 15 years). If you are a smoker, remember, quitting is the single most powerful action that you can take to decrease your risk of lung cancer and other pulmonary, breathing related problems. We know quitting is hard, and we are here to help.  Please let us know if there is anything we can do to help you meet your goal of quitting. If you are a former smoker, congratulations. We are proud of you! Remain smoke free! Remember you can refer friends or family members through the number above.  We will screen them to make sure they meet criteria for the program. Thank you for helping us take better care of you by participating in Lung Screening.  Lung RADS Categories:  Lung RADS 1: no nodules  or definitely non-concerning nodules.  Recommendation is for a repeat annual scan in 12 months.  Lung RADS 2:  nodules that are non-concerning in appearance and behavior with a very low likelihood of becoming an active cancer. Recommendation is for a repeat annual scan in 12 months.  Lung RADS 3: nodules that are probably non-concerning , includes nodules with a low likelihood of becoming an active cancer.  Recommendation is for a 6-month repeat screening scan. Often noted after an upper respiratory illness. We will be in touch to make sure you have no questions, and to schedule your 6-month scan.  Lung RADS 4 A: nodules with concerning findings, recommendation is most often for a follow up scan in 3 months or additional testing based on our provider's assessment of the scan. We will be in touch to make sure you have no questions and to schedule the recommended 3 month follow up scan.  Lung RADS 4 B:  indicates findings that are concerning. We will be in touch with you to schedule additional diagnostic testing based on our provider's  assessment of the scan.   

## 2019-05-26 NOTE — Progress Notes (Signed)
Shared Decision Making Visit Lung Cancer Screening Program [phone])   Eligibility:  Age 64 y.o.  Pack Years Smoking History Calculation  30 pack year smoking history (# packs/per year x # years smoked)  Recent History of coughing up blood  no  Unexplained weight loss? no ( >Than 15 pounds within the last 6 months )  Prior History Lung / other cancer no (Diagnosis within the last 5 years already requiring surveillance chest CT Scans).  Smoking Status Current Smoker  Former Smokers: Years since quit: NA  Quit Date: NA  Visit Components:  Discussion included one or more decision making aids. yes  Discussion included risk/benefits of screening. yes  Discussion included potential follow up diagnostic testing for abnormal scans. yes  Discussion included meaning and risk of over diagnosis. yes  Discussion included meaning and risk of False Positives. yes  Discussion included meaning of total radiation exposure. yes  Counseling Included:  Importance of adherence to annual lung cancer LDCT screening. yes  Impact of comorbidities on ability to participate in the program. yes  Ability and willingness to under diagnostic treatment. yes  Smoking Cessation Counseling:  Current Smokers:   Discussed importance of smoking cessation. yes  Information about tobacco cessation classes and interventions provided to patient. yes  Patient provided with "ticket" for LDCT Scan. yes  Symptomatic Patient. no  Counseling: NA  Diagnosis Code: Tobacco Use Z72.0  Asymptomatic Patient yes  Counseling (Intermediate counseling: > three minutes counseling) [id number]  Former Smokers:   Discussed the importance of maintaining cigarette abstinence. yes  Diagnosis Code: Personal History of Nicotine Dependence. [id number]  Information about tobacco cessation classes and interventions provided to patient. Yes  Patient provided with "ticket" for LDCT Scan. yes  Written Order for Lung Cancer  Screening with LDCT placed in Epic. Yes (CT Chest Lung Cancer Screening Low Dose W/O CM) [id number] Z12.2-Screening of respiratory organs Z87.891-Personal history of nicotine dependence  I have spent 25 minutes of face to face time with Ms. Fogelman discussing the risks and benefits of lung cancer screening. We viewed a power point together that explained in detail the above noted topics. We paused at intervals to allow for questions to be asked and answered to ensure understanding.We discussed that the single most powerful action that she can take to decrease her risk of developing lung cancer is to quit smoking. We discussed whether or not she is ready to commit to setting a quit date. We discussed options for tools to aid in quitting smoking including nicotine replacement therapy, non-nicotine medications, support groups, Quit Smart classes, and behavior modification. We discussed that often times setting smaller, more achievable goals, such as eliminating 1 cigarette a day for a week and then 2 cigarettes a day for a week can be helpful in slowly decreasing the number of cigarettes smoked. This allows for a sense of accomplishment as well as providing a clinical benefit. I gave her the " Be Stronger Than Your Excuses" card with contact information for community resources, classes, free nicotine replacement therapy, and access to mobile apps, text messaging, and on-line smoking cessation help. I have also given her my card and contact information in the event she needs to contact me. We discussed the time and location of the scan, and that either Doroteo Glassman RN or I will call with the results within 24-48 hours of receiving them. I have offered her  a copy of the power point we viewed  as a resource in the event they need  reinforcement of the concepts we discussed today in the office. The patient verbalized understanding of all of  the above and had no further questions upon leaving the office. They have my  contact information in the event they have any further questions.  I spent 4 minutes counseling on smoking cessation and the health risks of continued tobacco abuse.  She has quit once in the past using patches. She has not had success with the patches this time however. She has Nicotrol prescribed and she is going to set a date to quit once she is mentally ready to do so.   I explained to the patient that there has been a high incidence of coronary artery disease noted on these exams. I explained that this is a non-gated exam therefore degree or severity cannot be determined. This patient is currently on statin therapy. I have asked the patient to follow-up with their PCP regarding any incidental finding of coronary artery disease and management with diet or medication as their PCP  feels is clinically indicated. The patient verbalized understanding of the above and had no further questions upon completion of the visit.      Magdalen Spatz, NP 05/26/2019

## 2019-05-29 ENCOUNTER — Telehealth: Payer: Self-pay | Admitting: Acute Care

## 2019-05-29 NOTE — Progress Notes (Signed)
Call attempted, no answer. LM to call back , and call back number on both work and mobile VM.

## 2019-05-29 NOTE — Telephone Encounter (Signed)
I have attempted to call the patient with the results of the low dose CT.There was no answer at either her mobile or work number. I have left a HIPPA compliant message requesting that the patient call the office so I can discuss the results with her.I left the office number on both  voice mails to facilitate return call.

## 2019-05-30 NOTE — Telephone Encounter (Signed)
I called the patient again today. There was no answer. Will await return call, and will call again Monday  If no contact.

## 2019-05-30 NOTE — Telephone Encounter (Signed)
Pt returning call for CT results. Will be free after 3:30pm today.

## 2019-06-02 DIAGNOSIS — Z6824 Body mass index (BMI) 24.0-24.9, adult: Secondary | ICD-10-CM | POA: Diagnosis not present

## 2019-06-02 DIAGNOSIS — Z1151 Encounter for screening for human papillomavirus (HPV): Secondary | ICD-10-CM | POA: Diagnosis not present

## 2019-06-02 DIAGNOSIS — Z01419 Encounter for gynecological examination (general) (routine) without abnormal findings: Secondary | ICD-10-CM | POA: Diagnosis not present

## 2019-06-02 DIAGNOSIS — Z1231 Encounter for screening mammogram for malignant neoplasm of breast: Secondary | ICD-10-CM | POA: Diagnosis not present

## 2019-06-02 DIAGNOSIS — B977 Papillomavirus as the cause of diseases classified elsewhere: Secondary | ICD-10-CM | POA: Diagnosis not present

## 2019-06-03 NOTE — Progress Notes (Signed)
Please call patient and let them  know their  low dose Ct was read as a Lung RADS 2: nodules that are benign in appearance and behavior with a very low likelihood of becoming a clinically active cancer due to size or lack of growth. Recommendation per radiology is for a repeat LDCT in 12 months. .Please let them  know we will order and schedule their  annual screening scan for 05/2020. Please let them  know there was notation of CAD on their  scan.  Please remind the patient  that this is a non-gated exam therefore degree or severity of disease  cannot be determined. Please have them  follow up with their PCP regarding potential risk factor modification, dietary therapy or pharmacologic therapy if clinically indicated. Pt.  is currently on statin therapy. Please place order for annual  screening scan for  05/2020 and fax results to PCP. Thanks so much.  Pt did receive her second Covid Vaccine 05/21/2019 in her left arm, which was 5 days prior to having this screening CT Scan. That may explain the lymphadenopathy. She has also had a mammogram this week. She is awaiting the results

## 2019-06-13 ENCOUNTER — Other Ambulatory Visit: Payer: Self-pay | Admitting: *Deleted

## 2019-06-13 DIAGNOSIS — Z87891 Personal history of nicotine dependence: Secondary | ICD-10-CM

## 2019-06-13 DIAGNOSIS — F17210 Nicotine dependence, cigarettes, uncomplicated: Secondary | ICD-10-CM

## 2019-07-15 DIAGNOSIS — E782 Mixed hyperlipidemia: Secondary | ICD-10-CM | POA: Diagnosis not present

## 2019-07-15 DIAGNOSIS — J439 Emphysema, unspecified: Secondary | ICD-10-CM | POA: Diagnosis not present

## 2019-07-15 DIAGNOSIS — R590 Localized enlarged lymph nodes: Secondary | ICD-10-CM | POA: Diagnosis not present

## 2019-07-15 DIAGNOSIS — F419 Anxiety disorder, unspecified: Secondary | ICD-10-CM | POA: Diagnosis not present

## 2019-07-17 ENCOUNTER — Other Ambulatory Visit: Payer: Self-pay | Admitting: Family Medicine

## 2019-07-17 DIAGNOSIS — R590 Localized enlarged lymph nodes: Secondary | ICD-10-CM

## 2019-08-01 ENCOUNTER — Ambulatory Visit
Admission: RE | Admit: 2019-08-01 | Discharge: 2019-08-01 | Disposition: A | Discharge status: Home / Self Care | Payer: BC Managed Care – PPO | Source: Ambulatory Visit | Attending: Family Medicine | Admitting: Family Medicine

## 2019-08-01 ENCOUNTER — Other Ambulatory Visit: Payer: Self-pay | Admitting: Family Medicine

## 2019-08-01 ENCOUNTER — Other Ambulatory Visit: Payer: Self-pay

## 2019-08-01 DIAGNOSIS — N6489 Other specified disorders of breast: Secondary | ICD-10-CM | POA: Diagnosis not present

## 2019-08-01 DIAGNOSIS — R928 Other abnormal and inconclusive findings on diagnostic imaging of breast: Secondary | ICD-10-CM | POA: Diagnosis not present

## 2019-08-01 DIAGNOSIS — R590 Localized enlarged lymph nodes: Secondary | ICD-10-CM

## 2019-10-02 ENCOUNTER — Ambulatory Visit
Admission: RE | Admit: 2019-10-02 | Discharge: 2019-10-02 | Disposition: A | Discharge status: Home / Self Care | Payer: BC Managed Care – PPO | Source: Ambulatory Visit | Attending: Family Medicine | Admitting: Family Medicine

## 2019-10-02 ENCOUNTER — Other Ambulatory Visit: Payer: Self-pay

## 2019-10-02 DIAGNOSIS — R590 Localized enlarged lymph nodes: Secondary | ICD-10-CM

## 2019-12-30 DIAGNOSIS — J019 Acute sinusitis, unspecified: Secondary | ICD-10-CM | POA: Diagnosis not present

## 2020-01-13 ENCOUNTER — Encounter (HOSPITAL_COMMUNITY): Payer: Self-pay | Admitting: Emergency Medicine

## 2020-01-13 ENCOUNTER — Emergency Department (HOSPITAL_COMMUNITY)
Admission: EM | Admit: 2020-01-13 | Discharge: 2020-01-13 | Disposition: A | Discharge status: Home / Self Care | Payer: BC Managed Care – PPO | Attending: Emergency Medicine | Admitting: Emergency Medicine

## 2020-01-13 ENCOUNTER — Emergency Department (HOSPITAL_COMMUNITY): Payer: BC Managed Care – PPO

## 2020-01-13 DIAGNOSIS — F17200 Nicotine dependence, unspecified, uncomplicated: Secondary | ICD-10-CM | POA: Insufficient documentation

## 2020-01-13 DIAGNOSIS — R918 Other nonspecific abnormal finding of lung field: Secondary | ICD-10-CM | POA: Diagnosis not present

## 2020-01-13 DIAGNOSIS — I493 Ventricular premature depolarization: Secondary | ICD-10-CM | POA: Diagnosis not present

## 2020-01-13 DIAGNOSIS — Z7982 Long term (current) use of aspirin: Secondary | ICD-10-CM | POA: Diagnosis not present

## 2020-01-13 DIAGNOSIS — I10 Essential (primary) hypertension: Secondary | ICD-10-CM | POA: Insufficient documentation

## 2020-01-13 DIAGNOSIS — R002 Palpitations: Secondary | ICD-10-CM | POA: Diagnosis not present

## 2020-01-13 LAB — CBC
HCT: 44.2 % (ref 36.0–46.0)
Hemoglobin: 14.1 g/dL (ref 12.0–15.0)
MCH: 29.9 pg (ref 26.0–34.0)
MCHC: 31.9 g/dL (ref 30.0–36.0)
MCV: 93.8 fL (ref 80.0–100.0)
Platelets: 275 K/uL (ref 150–400)
RBC: 4.71 MIL/uL (ref 3.87–5.11)
RDW: 12.8 % (ref 11.5–15.5)
WBC: 9 K/uL (ref 4.0–10.5)
nRBC: 0 % (ref 0.0–0.2)

## 2020-01-13 LAB — BASIC METABOLIC PANEL WITH GFR
Anion gap: 11 (ref 5–15)
BUN: 16 mg/dL (ref 8–23)
CO2: 27 mmol/L (ref 22–32)
Calcium: 9.2 mg/dL (ref 8.9–10.3)
Chloride: 104 mmol/L (ref 98–111)
Creatinine, Ser: 0.86 mg/dL (ref 0.44–1.00)
GFR, Estimated: >60 mL/min
Glucose, Bld: 117 mg/dL — ABNORMAL HIGH (ref 70–99)
Potassium: 3.9 mmol/L (ref 3.5–5.1)
Sodium: 142 mmol/L (ref 135–145)

## 2020-01-13 LAB — TROPONIN I (HIGH SENSITIVITY)
Troponin I (High Sensitivity): 3 ng/L (ref <18)
Troponin I (High Sensitivity): 3 ng/L

## 2020-01-13 MED ORDER — HYDROCHLOROTHIAZIDE 25 MG PO TABS: 25.0000 mg | ORAL_TABLET | Freq: Every day | ORAL | 0 refills | Status: AC

## 2020-01-13 NOTE — ED Triage Notes (Signed)
Pt reports palpitations that began while she was at work. Had her BP checked which was in the 200s, no hx of htn. Reports recently stopped smoking 10 days ago, also has hx of anxiety which she took 1/4 of a tablet of lorazepam for. A/ox4, resp e/u, nad.

## 2020-01-13 NOTE — ED Notes (Signed)
Pt discharged ambulatory. All questions and concerns addressed. No complaints at this time.   

## 2020-01-13 NOTE — ED Notes (Signed)
Pt reports palpitations while at work today. Pt previously diagnosed with anxiety and pt took home anxiety meds around 0930. What was concerning to the pt was a coworker took her BP with a wrist monitor and SBP in the 200s.

## 2020-01-13 NOTE — Discharge Instructions (Signed)
The radiologist did see an area in the right upper lung that could be a nodule.  They recommended a follow up CXR.  You can schedule this with your doctor in a couple of weeks.    Start taking the blood pressure medication as we discussed.  Follow up with your doctor to have that rechecked.

## 2020-01-13 NOTE — ED Provider Notes (Signed)
[facility] EMERGENCY DEPARTMENT Provider Note   CSN: [id number] Arrival date & time: 01/13/20  1013     History Chief Complaint  Patient presents with  . Palpitations  . Hypertension    Courtney Hernandez is a 64 y.o. female.  HPI   Patient presented to the ED for evaluation of palpitations and high blood pressure.  Patient states she was at work today where she started to have issues with palpitations.  She started to feel somewhat anxious.  Patient does have a history of anxiety and she does takes lorazepam for that intermittently.  Coworkers checked her blood pressure and they noted that it was elevated over 412 systolic.  They were using a wrist device to check her blood pressure.  Patient then came to the ED for evaluation because of her high blood pressure.  She normally does not have high blood pressure.  Patient did have some discomfort in her chest with the palpitations but not any chest pain.  She is not feeling short of breath.  She is not having any numbness or weakness.  Past Medical History:  Diagnosis Date  . Anxiety   . Combined hyperlipidemia   . Emphysema lung ([place])   . Hot flashes   . Low back pain   . Mixed hyperlipidemia   . MVP (mitral valve prolapse)   . Tobacco abuse     Patient Active Problem List   Diagnosis Date Noted  . Pulsatile abdominal mass 06/01/2016  . Mitral regurgitation 12/22/2013  . Tobacco abuse 12/22/2013    Past Surgical History:  Procedure Laterality Date  . BILATERAL TUBAL LIGATION Bilateral   . COLONOSCOPY    . UTERINE POLYPECTOMY       OB History   No obstetric history on file.     Family History  Problem Relation Age of Onset  . Heart attack Father   . Cancer Sister   . Cancer Maternal Grandmother   . Cancer Sister   . Breast cancer Sister     Social History   Tobacco Use  . Smoking status: Current Every Day Smoker  . Smokeless tobacco: Never Used  Substance Use Topics  . Alcohol use:  Yes  . Drug use: No    Home Medications Prior to Admission medications   Medication Sig Start Date End Date Taking? Authorizing Provider  aspirin 81 MG tablet Take 81 mg by mouth daily.   Yes [provider]  atorvastatin (LIPITOR) 40 MG tablet Take 40 mg by mouth daily.   Yes [provider]  clonazePAM (KLONOPIN) 0.5 MG tablet Take 0.125 mg by mouth 2 (two) times daily as needed.  11/11/13  Yes [provider]  hydrochlorothiazide (HYDRODIURIL) 25 MG tablet Take 1 tablet (25 mg total) by mouth daily. 01/13/20   Dorie Rank, MD  nadolol (CORGARD) 20 MG tablet Take 0.5 tablets (10 mg total) by mouth daily. Patient not taking: Reported on 01/13/2020 12/02/18   Thompson Grayer, MD    Allergies    Chantix [varenicline]  Review of Systems   Review of Systems  All other systems reviewed and are negative.   Physical Exam Updated Vital Signs BP (!) 141/80   Pulse 69   Temp 98.1 F (36.7 C)   Resp 20   SpO2 99%   Physical Exam Vitals and nursing note reviewed.  Constitutional:      General: She is not in acute distress.    Appearance: She is well-developed.  HENT:  Head: Normocephalic and atraumatic.     Right Ear: External ear normal.     Left Ear: External ear normal.  Eyes:     General: No scleral icterus.       Right eye: No discharge.        Left eye: No discharge.     Conjunctiva/sclera: Conjunctivae normal.  Neck:     Trachea: No tracheal deviation.  Cardiovascular:     Rate and Rhythm: Normal rate and regular rhythm.  Pulmonary:     Effort: Pulmonary effort is normal. No respiratory distress.     Breath sounds: Normal breath sounds. No stridor. No wheezing or rales.  Abdominal:     General: Bowel sounds are normal. There is no distension.     Palpations: Abdomen is soft.     Tenderness: There is no abdominal tenderness. There is no guarding or rebound.  Musculoskeletal:        General: No tenderness.     Cervical back: Neck supple.    Skin:    General: Skin is warm and dry.     Findings: No rash.  Neurological:     Mental Status: She is alert.     Cranial Nerves: No cranial nerve deficit (no facial droop, extraocular movements intact, no slurred speech).     Sensory: No sensory deficit.     Motor: No abnormal muscle tone or seizure activity.     Coordination: Coordination normal.     ED Results / Procedures / Treatments   Labs (all labs ordered are listed, but only abnormal results are displayed) Labs Reviewed  BASIC METABOLIC PANEL - Abnormal; Notable for the following components:      Result Value   Glucose, Bld 117 (*)    All other components within normal limits  CBC  TROPONIN I (HIGH SENSITIVITY)  TROPONIN I (HIGH SENSITIVITY)    EKG EKG Interpretation  Date/Time:  Tuesday January 13 2020 10:28:37 EST Ventricular Rate:  72 PR Interval:  132 QRS Duration: 82 QT Interval:  394 QTC Calculation: 431 R Axis:   74 Text Interpretation: Normal sinus rhythm Nonspecific T wave abnormality Abnormal ECG No significant change since last tracing Confirmed by Dorie Rank [phone]) on 01/13/2020 11:19:32 AM   Radiology DG Chest 2 View  Addendum Date: 01/13/2020   ADDENDUM REPORT: 01/13/2020 11:04 ADDENDUM: Report phoned to Dr. Billy Fischer by me at 11:02 am on 01/13/2020. Electronically Signed   By: Marcello Moores  Register   On: 01/13/2020 11:04   Result Date: 01/13/2020 CLINICAL DATA:  Palpitations. EXAM: CHEST - 2 VIEW COMPARISON:  CT 05/26/2019. Chest x-ray 10/16/2018, 03/14/2015, 02/17/2008. FINDINGS: Mediastinum and hilar structures normal. Heart size normal. Bilateral upper lobe and left base mild pleuroparenchymal thickening consistent with scarring again noted. Questionable faint density noted over the right upper lung. A small focal pulmonary infiltrate or lesion cannot be excluded and follow-up PA and lateral chest x-ray suggested. If this faint density remains nonenhanced chest CT suggested. No pleural effusion  or pneumothorax. Mild degenerative change thoracic spine. IMPRESSION: 1. Bilateral upper lobe and left base mild pleuroparenchymal thickening consistent with scarring again noted. 2. Questionable faint density noted over the right upper lung. A small focal pulmonary infiltrate or lesion cannot be excluded and follow-up PA and lateral chest x-ray suggested. If this density remains nonenhanced chest CT is suggested to further evaluate. These results will be called to the ordering clinician or representative by the Radiologist Assistant, and communication documented in the PACS or [employer].  Electronically Signed: By: Marcello Moores  Register On: 01/13/2020 10:53    Procedures Procedures (including critical care time)  Medications Ordered in ED Medications - No data to display  ED Course  I have reviewed the triage vital signs and the nursing notes.  Pertinent labs & imaging results that were available during my care of the patient were reviewed by me and considered in my medical decision making (see chart for details).  Clinical Course as of Jan 12 1358  Tue Jan 13, 2020  1146 Blood pressure 281 systolic at the bedside.  Patient was not given any blood pressure medications.   [VW]  8677 Laboratory tests reviewed.  CBC metabolic panel normal.  Serial troponins normal   [JK]    Clinical Course User Index [JK] Dorie Rank, MD   MDM Rules/Calculators/A&P                          Patient presented to the ED with complaints of palpitations and hypertension.  While in the ED the patient was noted to have a few PVCs on the monitor.  No evidence of any tacky dysrhythmia.  Symptoms are atypical for acute coronary syndrome.  Serial troponins negative.  Patient does have slightly elevated blood pressure.  At the bedside her blood pressure is 150/100.  No signs of hypertensive emergency but I do think she would benefit from starting a low-dose blood pressure medication.  I will start her on  hydrochlorothiazide.  Is a dental finding noted on chest x-ray.  I will have the patient follow-up with her primary care doctor for follow-up imaging.  Symptoms are not suggestive of infectious etiology Final Clinical Impression(s) / ED Diagnoses Final diagnoses:  Hypertension, unspecified type  PVC (premature ventricular contraction)    Rx / DC Orders ED Discharge Orders         Ordered    hydrochlorothiazide (HYDRODIURIL) 25 MG tablet  Daily        01/13/20 1357           Dorie Rank, MD 01/13/20 1400

## 2020-01-29 DIAGNOSIS — I10 Essential (primary) hypertension: Secondary | ICD-10-CM | POA: Diagnosis not present

## 2020-01-29 DIAGNOSIS — R9389 Abnormal findings on diagnostic imaging of other specified body structures: Secondary | ICD-10-CM | POA: Diagnosis not present

## 2020-01-29 DIAGNOSIS — Z23 Encounter for immunization: Secondary | ICD-10-CM | POA: Diagnosis not present

## 2020-02-09 ENCOUNTER — Ambulatory Visit
Admission: RE | Admit: 2020-02-09 | Discharge: 2020-02-09 | Disposition: A | Discharge status: Home / Self Care | Payer: BC Managed Care – PPO | Source: Ambulatory Visit | Attending: Family Medicine | Admitting: Family Medicine

## 2020-02-09 ENCOUNTER — Other Ambulatory Visit: Payer: Self-pay | Admitting: Family Medicine

## 2020-02-09 DIAGNOSIS — R918 Other nonspecific abnormal finding of lung field: Secondary | ICD-10-CM | POA: Diagnosis not present

## 2020-02-09 DIAGNOSIS — R9389 Abnormal findings on diagnostic imaging of other specified body structures: Secondary | ICD-10-CM

## 2020-03-03 ENCOUNTER — Other Ambulatory Visit: Payer: Self-pay | Admitting: Family Medicine

## 2020-03-03 DIAGNOSIS — R918 Other nonspecific abnormal finding of lung field: Secondary | ICD-10-CM

## 2020-03-18 ENCOUNTER — Ambulatory Visit
Admission: RE | Admit: 2020-03-18 | Discharge: 2020-03-18 | Disposition: A | Discharge status: Home / Self Care | Payer: BC Managed Care – PPO | Source: Ambulatory Visit | Attending: Family Medicine | Admitting: Family Medicine

## 2020-03-18 DIAGNOSIS — R918 Other nonspecific abnormal finding of lung field: Secondary | ICD-10-CM

## 2020-04-15 DIAGNOSIS — C44712 Basal cell carcinoma of skin of right lower limb, including hip: Secondary | ICD-10-CM | POA: Diagnosis not present

## 2020-04-19 DIAGNOSIS — Z23 Encounter for immunization: Secondary | ICD-10-CM | POA: Diagnosis not present

## 2020-04-19 DIAGNOSIS — E782 Mixed hyperlipidemia: Secondary | ICD-10-CM | POA: Diagnosis not present

## 2020-04-19 DIAGNOSIS — I10 Essential (primary) hypertension: Secondary | ICD-10-CM | POA: Diagnosis not present

## 2020-04-19 DIAGNOSIS — Z0000 Encounter for general adult medical examination without abnormal findings: Secondary | ICD-10-CM | POA: Diagnosis not present

## 2020-04-19 DIAGNOSIS — F419 Anxiety disorder, unspecified: Secondary | ICD-10-CM | POA: Diagnosis not present

## 2020-06-29 DIAGNOSIS — J439 Emphysema, unspecified: Secondary | ICD-10-CM | POA: Diagnosis not present

## 2020-08-03 DIAGNOSIS — H5203 Hypermetropia, bilateral: Secondary | ICD-10-CM | POA: Diagnosis not present

## 2020-08-18 IMAGING — DX DG CHEST 2V
2 series · 2 of 2 positions shown · non-contrast
Comparison: Radiographs March 14, 2015.

CLINICAL DATA: Tachycardia, chest pressure.

EXAM:
CHEST - 2 VIEW

[chest pa]
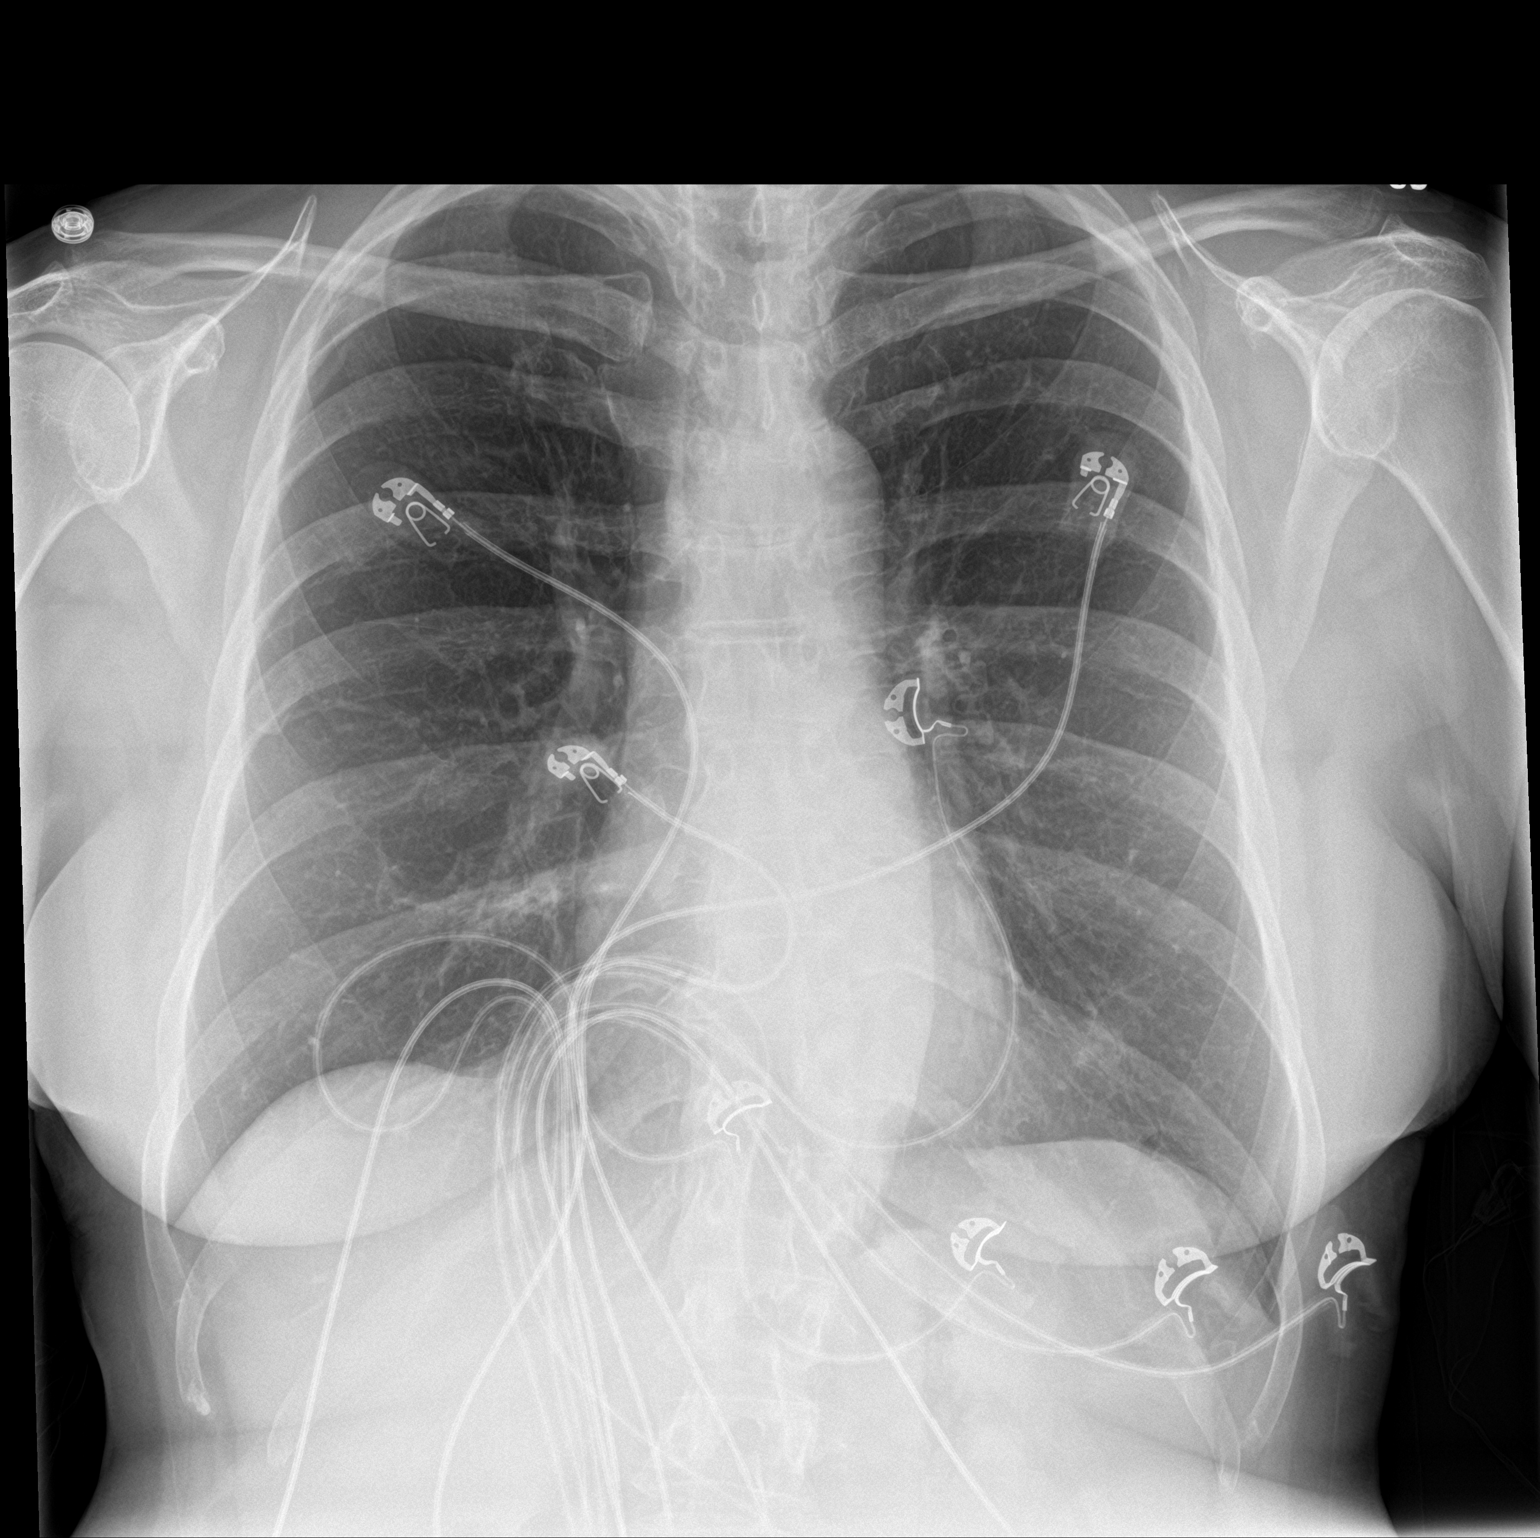

[chest lat]
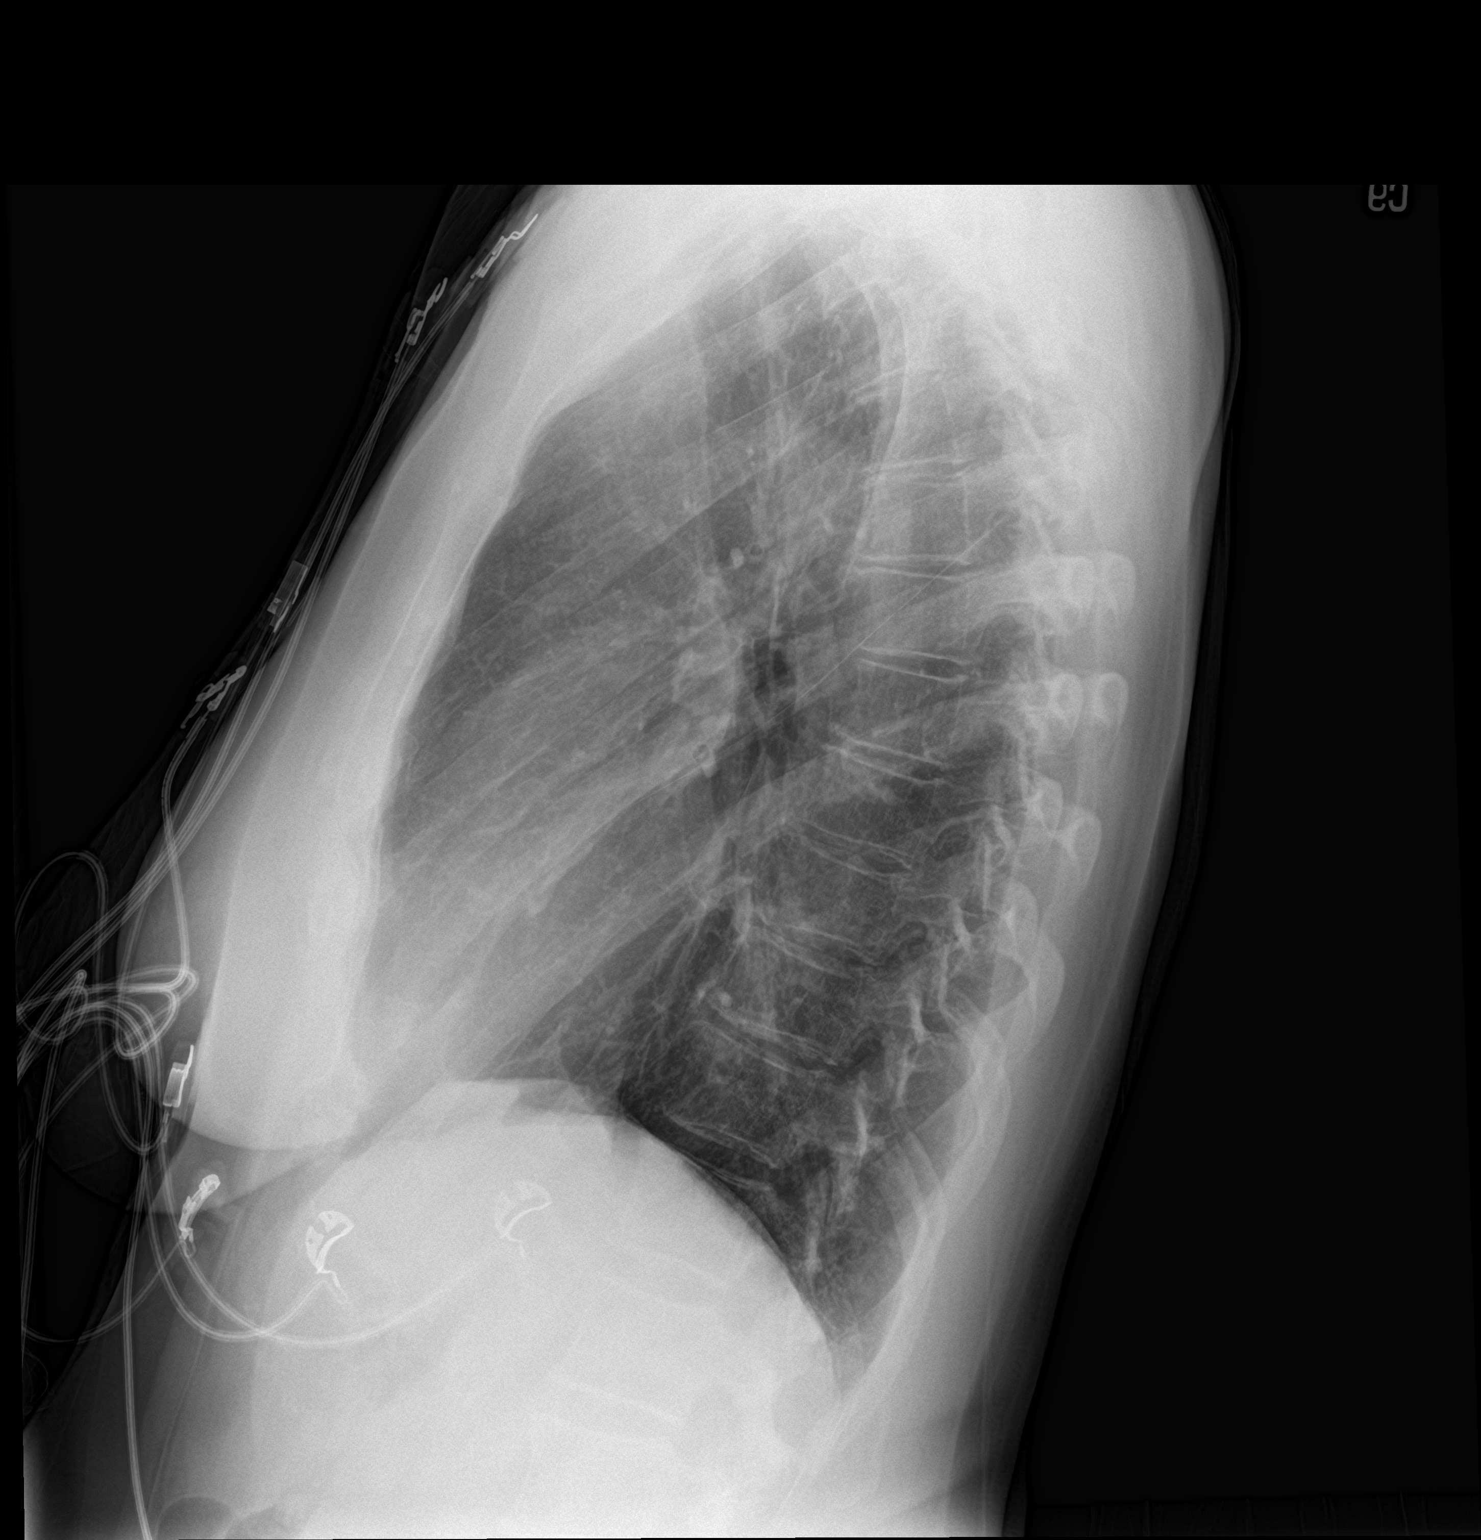

[2 of 2 positions shown; findings below may reference images not displayed]

FINDINGS: The heart size and mediastinal contours are within normal limits.
Both lungs are clear. No pneumothorax or pleural effusion is noted.
The visualized skeletal structures are unremarkable.
IMPRESSION: No active cardiopulmonary disease.

## 2020-09-23 DIAGNOSIS — Z01419 Encounter for gynecological examination (general) (routine) without abnormal findings: Secondary | ICD-10-CM | POA: Diagnosis not present

## 2020-09-23 DIAGNOSIS — Z1231 Encounter for screening mammogram for malignant neoplasm of breast: Secondary | ICD-10-CM | POA: Diagnosis not present

## 2020-09-23 DIAGNOSIS — Z01411 Encounter for gynecological examination (general) (routine) with abnormal findings: Secondary | ICD-10-CM | POA: Diagnosis not present

## 2020-09-23 DIAGNOSIS — Z124 Encounter for screening for malignant neoplasm of cervix: Secondary | ICD-10-CM | POA: Diagnosis not present

## 2020-09-27 ENCOUNTER — Other Ambulatory Visit: Payer: Self-pay | Admitting: Obstetrics & Gynecology

## 2020-09-27 DIAGNOSIS — E2839 Other primary ovarian failure: Secondary | ICD-10-CM

## 2020-12-25 NOTE — Progress Notes (Signed)
Cardiology Office Note Date:  12/25/2020  Patient ID:  Courtney, Hernandez 1955/08/26, MRN [id number] PCP:  Antony Contras, MD  Cardiologist:  Dr. Irish Lack Electrophysiologist: Dr. Rayann Heman     Chief Complaint:  over due  History of Present Illness: Courtney Hernandez is a 65 y.o. female with history of MVP, palpitations historically associated with anxiety.  She was referred to Dr. Irish Lack April 2018 for h/o MVP, palpitations associated with anxiety.  Review of prior echo noted only mild MR, no new w/u was recommended, planned for [place] to evaluate pulsatile abd AO (was negative), and urged to quit smoking.  She had an ER visit  Sept 2020 with increased frequency of tachycardia/palpitations associated with nausea, vague chest dicomfort.  Labs were unremarkable, EKG described as SR and in d/w cardiology service [planned for outpt monitoring  Monitor noted episode that DOD felt possibly was VT, recommended echo and EP consult  She saw Dr. Rayann Heman via virtual visit 10/19//2020.   He noted  Echo 11/22/2018- EF 55-60%, RV size/ function was normal, normal valve function Event monitor 11/2018 is reviewed which revealed only sinus rhythm during the majority of her episodes.  She did have one NSVT 11/19/2018 at 6:09 am The majority of episodes appear to correlate to sinus rhythm.  She did have NSVT on event monitor x 1.  Given a structurally normal heart, recommended nadolol 10mg  daily  Urged/advised against the use of xanax for her palpitations, given addictive properties, no additional w/u was recommended or planned He suspected high BP as a possible etiology of her feeling her heart beating hard, and encouraged her to check her BP with symptoms. To have 6-8 week follow up.  I saw her 01/31/2019 She is doing well, mentions that she filled and has the nadolol but has not started taking it.  Says that she has not felt the palpitations since her visit with Dr. Rayann Heman until today and would like to  avoid a daily medicine if possible.  Says she had just finished up a conference call and was on her way up, always gets a little anxious coming to doctors office and felt some palpitations.  She asks to check her feet/circulation noting that for a couple months that when she steps with her L foot she notices that the color of the top of her foot goes from reddish to white and feels a little tight.  This started a couple months ago.  Also feels like it tingles, is not numb, not "asleep" but tingles. No symptoms of claudication, she will get a cramp in her calf at night occassionally, never when ambulating She denies any other concerns. I asked her to try the nadolol daily and monitor symptoms, response to therapy. Her exam noted no findings to suggest PVD, no further w/u at that time, recommended f/u with her PMD and if persistent or progressive we could consider ABI/US.  She had an ER visit 01/13/20 with c/o palpitations and HTN, reported started feeling anxious and palpitations at work, coworker checked her BP with a wrist cuff measuring > 200 Nadolol was on her med list but reported as not taking. BP 150/100 no arrhythmias described Rec starting HCTZ   TODAY She has identified times of anxiety as trigger for her palpitations, she has klonopin that she uses at these times and works quite well for her. She feels well otherwise She walks at work though no formal exercise She has bought a step machine and is actively working on  quitting smoking She retires next [employer] and thinks this will help a lot of her anxiety at least She is quite motivated to make exercise and quittig smoking priority Her PMD monitors and manages her lipids, these are reported to be good.   Past Medical History:  Diagnosis Date   Anxiety    Combined hyperlipidemia    Emphysema lung ([facility])    Hot flashes    Low back pain    Mixed hyperlipidemia    MVP (mitral valve prolapse)    Tobacco abuse     Past Surgical  History:  Procedure Laterality Date   BILATERAL TUBAL LIGATION Bilateral    COLONOSCOPY     UTERINE POLYPECTOMY      Current Outpatient Medications  Medication Sig Dispense Refill   aspirin 81 MG tablet Take 81 mg by mouth daily.     atorvastatin (LIPITOR) 40 MG tablet Take 40 mg by mouth daily.     clonazePAM (KLONOPIN) 0.5 MG tablet Take 0.125 mg by mouth 2 (two) times daily as needed.   2   hydrochlorothiazide (HYDRODIURIL) 25 MG tablet Take 1 tablet (25 mg total) by mouth daily. 30 tablet 0   nadolol (CORGARD) 20 MG tablet Take 0.5 tablets (10 mg total) by mouth daily. (Patient not taking: Reported on 01/13/2020) 30 tablet 6   No current facility-administered medications for this visit.    Allergies:   Chantix [varenicline]   Social History:  The patient  reports that she has been smoking. She has never used smokeless tobacco. She reports current alcohol use. She reports that she does not use drugs.   Family History:  The patient's family history includes Breast cancer in her sister; Cancer in her maternal grandmother, sister, and sister; Heart attack in her father.  ROS:  Please see the history of present illness.  All other systems are reviewed and otherwise negative.   PHYSICAL EXAM:  VS:  There were no vitals taken for this visit. BMI: There is no height or weight on file to calculate BMI. Well nourished, well developed, in no acute distress  HEENT: normocephalic, atraumatic  Neck: no JVD, carotid bruits or masses Cardiac:  RRR with a few extrasystoles; no significant murmurs, no rubs, or gallops Lungs:   CTA b/l, no wheezing, rhonchi or rales  Abd: soft, nontender MS: no deformity or atrophy Ext: no edema, her feet are slightly erythematous, slightly cool,  not cyanotic, she has good PT and DP pulses, and brisk cap refill b/l Skin: warm and dry, no rash Neuro:  No gross deficits appreciated Psych: euthymic mood, full affect     EKG:  done today and reviewed by  myself SR 89bpm, no acute/ischemic looking changes   30 day monitor, oct 2020 Normal sinus rhythm with occasional PVCs. No pathologic arrhythmias.  11/22/2018 TTE IMPRESSIONS  1. Left ventricular ejection fraction, by visual estimation, is 55 to 60%. The left ventricle has normal function. Normal left ventricular size. There is no left ventricular hypertrophy.  2. Global right ventricle has normal systolic function.The right ventricular size is normal. No increase in right ventricular wall thickness.  3. Left atrial size was normal.  4. Right atrial size was normal.  5. The mitral valve is normal in structure. No evidence of mitral valve regurgitation. No evidence of mitral stenosis.  6. The tricuspid valve is normal in structure. Tricuspid valve regurgitation is trivial.  7. The aortic valve is normal in structure. Aortic valve regurgitation was not visualized by color  flow Doppler. Structurally normal aortic valve, with no evidence of sclerosis or stenosis.  8. The pulmonic valve was normal in structure. Pulmonic valve regurgitation is not visualized by color flow Doppler.  9. Normal pulmonary artery systolic pressure. 10. The inferior vena cava is normal in size with greater than 50% respiratory variability, suggesting right atrial pressure of 3 mmHg.     Recent Labs: 01/13/2020: BUN 16; Creatinine, Ser 0.86; Hemoglobin 14.1; Platelets 275; Potassium 3.9; Sodium 142  No results found for requested labs within last 8760 hours.   CrCl cannot be calculated (Patient's most recent lab result is older than the maximum 21 days allowed.).   Wt Readings from Last 3 Encounters:  01/31/19 142 lb (64.4 kg)  12/02/18 147 lb (66.7 kg)  10/16/18 150 lb (68 kg)     Other studies reviewed: Additional studies/records reviewed today include: summarized above  ASSESSMENT AND PLAN:  1. Palpitations     Associated with anxiety  Discussed if change in behavior to let us know   2. HTN Looks  OK, exercise will help  PMD does labs  Disposition: we will see her back in a year, sooner if needed    Current medicines are reviewed at length with the patient today.  The patient did not have any concerns regarding medicines.  Venetia Night, PA-C 12/25/2020 4:35 PM     [place] [address] [phone] (office)  [phone] (fax)

## 2020-12-28 ENCOUNTER — Other Ambulatory Visit: Payer: Self-pay

## 2020-12-28 ENCOUNTER — Ambulatory Visit (INDEPENDENT_AMBULATORY_CARE_PROVIDER_SITE_OTHER): Payer: BC Managed Care – PPO | Admitting: Physician Assistant

## 2020-12-28 ENCOUNTER — Encounter: Payer: Self-pay | Admitting: Physician Assistant

## 2020-12-28 VITALS — BP 138/70 | HR 89 | Ht 65.5 in | Wt 154.6 lb

## 2020-12-28 DIAGNOSIS — I10 Essential (primary) hypertension: Secondary | ICD-10-CM | POA: Diagnosis not present

## 2020-12-28 DIAGNOSIS — I493 Ventricular premature depolarization: Secondary | ICD-10-CM

## 2020-12-28 DIAGNOSIS — R002 Palpitations: Secondary | ICD-10-CM | POA: Diagnosis not present

## 2020-12-28 NOTE — Patient Instructions (Signed)
Medication Instructions:   Your physician recommends that you continue on your current medications as directed. Please refer to the Current Medication list given to you today.  *If you need a refill on your cardiac medications before your next appointment, please call your pharmacy*   Lab Work: Courtney Hernandez   If you have labs (blood work) drawn today and your tests are completely normal, you will receive your results only by: [place] (if you have MyChart) OR A paper copy in the mail If you have any lab test that is abnormal or we need to change your treatment, we will call you to review the results.   Testing/Procedures: NONE ORDERED  TODAY    Follow-Up: At [facility], you and your health needs are our priority.  As part of our continuing mission to provide you with exceptional heart care, we have created designated Provider Care Teams.  These Care Teams include your primary Cardiologist (physician) and Advanced Practice Providers (APPs -  Physician Assistants and Nurse Practitioners) who all work together to provide you with the care you need, when you need it.  We recommend signing up for the patient portal called "MyChart".  Sign up information is provided on this After Visit Summary.  MyChart is used to connect with patients for Virtual Visits (Telemedicine).  Patients are able to view lab/test results, encounter notes, upcoming appointments, etc.  Non-urgent messages can be sent to your provider as well.   To learn more about what you can do with MyChart, go to [email or web address].    Your next appointment:   1 year(s)  The format for your next appointment:   In Person  Provider:   You will see one of the following Advanced Practice Providers on your designated Care Team:   Tommye Standard, [place] Legrand Como "Jonni Sanger" Chalmers Cater, [place]     Other Instructions

## 2021-02-15 DIAGNOSIS — L570 Actinic keratosis: Secondary | ICD-10-CM | POA: Diagnosis not present

## 2021-02-15 DIAGNOSIS — L821 Other seborrheic keratosis: Secondary | ICD-10-CM | POA: Diagnosis not present

## 2021-02-15 DIAGNOSIS — Z85828 Personal history of other malignant neoplasm of skin: Secondary | ICD-10-CM | POA: Diagnosis not present

## 2021-02-15 DIAGNOSIS — D1801 Hemangioma of skin and subcutaneous tissue: Secondary | ICD-10-CM | POA: Diagnosis not present

## 2021-02-15 DIAGNOSIS — D225 Melanocytic nevi of trunk: Secondary | ICD-10-CM | POA: Diagnosis not present

## 2021-02-15 DIAGNOSIS — L905 Scar conditions and fibrosis of skin: Secondary | ICD-10-CM | POA: Diagnosis not present

## 2021-02-15 DIAGNOSIS — L814 Other melanin hyperpigmentation: Secondary | ICD-10-CM | POA: Diagnosis not present

## 2021-02-23 ENCOUNTER — Other Ambulatory Visit: Payer: BC Managed Care – PPO

## 2021-02-24 DIAGNOSIS — J441 Chronic obstructive pulmonary disease with (acute) exacerbation: Secondary | ICD-10-CM | POA: Diagnosis not present

## 2021-03-30 ENCOUNTER — Ambulatory Visit
Admission: RE | Admit: 2021-03-30 | Discharge: 2021-03-30 | Disposition: A | Discharge status: Home / Self Care | Payer: Medicare HMO | Source: Ambulatory Visit | Attending: Obstetrics & Gynecology | Admitting: Obstetrics & Gynecology

## 2021-03-30 DIAGNOSIS — E2839 Other primary ovarian failure: Secondary | ICD-10-CM

## 2021-03-30 DIAGNOSIS — Z780 Asymptomatic menopausal state: Secondary | ICD-10-CM | POA: Diagnosis not present

## 2021-04-21 DIAGNOSIS — J439 Emphysema, unspecified: Secondary | ICD-10-CM | POA: Diagnosis not present

## 2021-04-21 DIAGNOSIS — Z0000 Encounter for general adult medical examination without abnormal findings: Secondary | ICD-10-CM | POA: Diagnosis not present

## 2021-04-21 DIAGNOSIS — E782 Mixed hyperlipidemia: Secondary | ICD-10-CM | POA: Diagnosis not present

## 2021-04-21 DIAGNOSIS — I700 Atherosclerosis of aorta: Secondary | ICD-10-CM | POA: Diagnosis not present

## 2021-04-21 DIAGNOSIS — I10 Essential (primary) hypertension: Secondary | ICD-10-CM | POA: Diagnosis not present

## 2021-04-21 DIAGNOSIS — Z23 Encounter for immunization: Secondary | ICD-10-CM | POA: Diagnosis not present

## 2021-04-21 DIAGNOSIS — F419 Anxiety disorder, unspecified: Secondary | ICD-10-CM | POA: Diagnosis not present

## 2021-04-21 DIAGNOSIS — I341 Nonrheumatic mitral (valve) prolapse: Secondary | ICD-10-CM | POA: Diagnosis not present

## 2021-04-21 DIAGNOSIS — H9313 Tinnitus, bilateral: Secondary | ICD-10-CM | POA: Diagnosis not present

## 2021-04-29 ENCOUNTER — Telehealth: Payer: Self-pay | Admitting: Acute Care

## 2021-04-29 DIAGNOSIS — Z87891 Personal history of nicotine dependence: Secondary | ICD-10-CM

## 2021-04-29 DIAGNOSIS — F17210 Nicotine dependence, cigarettes, uncomplicated: Secondary | ICD-10-CM

## 2021-04-29 NOTE — Telephone Encounter (Signed)
Left voicemail and call back number for patient to schedule annual LDCT ?

## 2021-05-19 ENCOUNTER — Ambulatory Visit
Admission: RE | Admit: 2021-05-19 | Discharge: 2021-05-19 | Disposition: A | Discharge status: Home / Self Care | Payer: Medicare HMO | Source: Ambulatory Visit | Attending: Acute Care | Admitting: Acute Care

## 2021-05-19 DIAGNOSIS — Z87891 Personal history of nicotine dependence: Secondary | ICD-10-CM

## 2021-05-19 DIAGNOSIS — I2510 Atherosclerotic heart disease of native coronary artery without angina pectoris: Secondary | ICD-10-CM | POA: Diagnosis not present

## 2021-05-19 DIAGNOSIS — J432 Centrilobular emphysema: Secondary | ICD-10-CM | POA: Diagnosis not present

## 2021-05-19 DIAGNOSIS — K8020 Calculus of gallbladder without cholecystitis without obstruction: Secondary | ICD-10-CM | POA: Diagnosis not present

## 2021-05-19 DIAGNOSIS — F17210 Nicotine dependence, cigarettes, uncomplicated: Secondary | ICD-10-CM

## 2021-05-23 ENCOUNTER — Other Ambulatory Visit: Payer: Self-pay

## 2021-05-23 DIAGNOSIS — F17210 Nicotine dependence, cigarettes, uncomplicated: Secondary | ICD-10-CM

## 2021-05-23 DIAGNOSIS — Z87891 Personal history of nicotine dependence: Secondary | ICD-10-CM

## 2021-05-23 DIAGNOSIS — Z122 Encounter for screening for malignant neoplasm of respiratory organs: Secondary | ICD-10-CM

## 2021-08-29 DIAGNOSIS — D124 Benign neoplasm of descending colon: Secondary | ICD-10-CM | POA: Diagnosis not present

## 2021-08-29 DIAGNOSIS — D120 Benign neoplasm of cecum: Secondary | ICD-10-CM | POA: Diagnosis not present

## 2021-08-29 DIAGNOSIS — K648 Other hemorrhoids: Secondary | ICD-10-CM | POA: Diagnosis not present

## 2021-08-29 DIAGNOSIS — Z86010 Personal history of colonic polyps: Secondary | ICD-10-CM | POA: Diagnosis not present

## 2021-08-29 DIAGNOSIS — D125 Benign neoplasm of sigmoid colon: Secondary | ICD-10-CM | POA: Diagnosis not present

## 2021-08-29 DIAGNOSIS — Z09 Encounter for follow-up examination after completed treatment for conditions other than malignant neoplasm: Secondary | ICD-10-CM | POA: Diagnosis not present

## 2021-08-29 DIAGNOSIS — D122 Benign neoplasm of ascending colon: Secondary | ICD-10-CM | POA: Diagnosis not present

## 2021-08-29 DIAGNOSIS — K5730 Diverticulosis of large intestine without perforation or abscess without bleeding: Secondary | ICD-10-CM | POA: Diagnosis not present

## 2021-08-29 DIAGNOSIS — K5520 Angiodysplasia of colon without hemorrhage: Secondary | ICD-10-CM | POA: Diagnosis not present

## 2021-08-31 DIAGNOSIS — D124 Benign neoplasm of descending colon: Secondary | ICD-10-CM | POA: Diagnosis not present

## 2021-08-31 DIAGNOSIS — D125 Benign neoplasm of sigmoid colon: Secondary | ICD-10-CM | POA: Diagnosis not present

## 2021-08-31 DIAGNOSIS — D120 Benign neoplasm of cecum: Secondary | ICD-10-CM | POA: Diagnosis not present

## 2021-08-31 DIAGNOSIS — D122 Benign neoplasm of ascending colon: Secondary | ICD-10-CM | POA: Diagnosis not present

## 2021-11-04 DIAGNOSIS — H9313 Tinnitus, bilateral: Secondary | ICD-10-CM | POA: Diagnosis not present

## 2021-12-01 DIAGNOSIS — N951 Menopausal and female climacteric states: Secondary | ICD-10-CM | POA: Diagnosis not present

## 2021-12-01 DIAGNOSIS — Z01411 Encounter for gynecological examination (general) (routine) with abnormal findings: Secondary | ICD-10-CM | POA: Diagnosis not present

## 2021-12-01 DIAGNOSIS — Z124 Encounter for screening for malignant neoplasm of cervix: Secondary | ICD-10-CM | POA: Diagnosis not present

## 2021-12-01 DIAGNOSIS — Z6824 Body mass index (BMI) 24.0-24.9, adult: Secondary | ICD-10-CM | POA: Diagnosis not present

## 2021-12-01 DIAGNOSIS — Z01419 Encounter for gynecological examination (general) (routine) without abnormal findings: Secondary | ICD-10-CM | POA: Diagnosis not present

## 2021-12-01 DIAGNOSIS — Z1231 Encounter for screening mammogram for malignant neoplasm of breast: Secondary | ICD-10-CM | POA: Diagnosis not present

## 2022-01-02 DIAGNOSIS — H9313 Tinnitus, bilateral: Secondary | ICD-10-CM | POA: Diagnosis not present

## 2022-01-02 DIAGNOSIS — H903 Sensorineural hearing loss, bilateral: Secondary | ICD-10-CM | POA: Diagnosis not present

## 2022-02-15 DIAGNOSIS — L812 Freckles: Secondary | ICD-10-CM | POA: Diagnosis not present

## 2022-02-15 DIAGNOSIS — Z85828 Personal history of other malignant neoplasm of skin: Secondary | ICD-10-CM | POA: Diagnosis not present

## 2022-02-15 DIAGNOSIS — L820 Inflamed seborrheic keratosis: Secondary | ICD-10-CM | POA: Diagnosis not present

## 2022-02-15 DIAGNOSIS — D225 Melanocytic nevi of trunk: Secondary | ICD-10-CM | POA: Diagnosis not present

## 2022-02-15 DIAGNOSIS — D485 Neoplasm of uncertain behavior of skin: Secondary | ICD-10-CM | POA: Diagnosis not present

## 2022-02-15 DIAGNOSIS — D0471 Carcinoma in situ of skin of right lower limb, including hip: Secondary | ICD-10-CM | POA: Diagnosis not present

## 2022-02-15 DIAGNOSIS — L821 Other seborrheic keratosis: Secondary | ICD-10-CM | POA: Diagnosis not present

## 2022-02-26 NOTE — Progress Notes (Signed)
Cardiology Office Note Date:  02/26/2022  Patient ID:  Courtney, Hernandez 08-04-1955, MRN [id number] PCP:  Antony Contras, MD  Cardiologist:  Dr. Irish Lack Electrophysiologist: Dr. Rayann Heman     Chief Complaint:   annual visit  History of Present Illness: Courtney Hernandez is a 67 y.o. female with history of MVP, palpitations historically associated with anxiety.  She was referred to Dr. Irish Lack April 2018 for h/o MVP, palpitations associated with anxiety.  Review of prior echo noted only mild MR, no new w/u was recommended, planned for [place] to evaluate pulsatile abd AO (was negative), and urged to quit smoking.  She had an ER visit  Sept 2020 with increased frequency of tachycardia/palpitations associated with nausea, vague chest dicomfort.  Labs were unremarkable, EKG described as SR and in d/w cardiology service planned for outpt monitoring  Monitor noted episode that DOD felt possibly was VT, recommended echo and EP consult  She saw Dr. Rayann Heman via virtual visit 10/19//2020.   He noted  Echo 11/22/2018- EF 55-60%, RV size/ function was normal, normal valve function Event monitor 11/2018 is reviewed which revealed only sinus rhythm during the majority of her episodes.  She did have one NSVT 11/19/2018 at 6:09 am The majority of episodes appear to correlate to sinus rhythm.  She did have NSVT on event monitor x 1.  Given a structurally normal heart, recommended nadolol '10mg'$  daily  Urged/advised against the use of xanax for her palpitations, given addictive properties, no additional w/u was recommended or planned He suspected high BP as a possible etiology of her feeling her heart beating hard, and encouraged her to check her BP with symptoms. To have 6-8 week follow up.  I saw her 01/31/2019 She is doing well, mentions that she filled and has the nadolol but has not started taking it.  Says that she has not felt the palpitations since her visit with Dr. Rayann Heman until today and would like  to avoid a daily medicine if possible.  Says she had just finished up a conference call and was on her way up, always gets a little anxious coming to doctors office and felt some palpitations.  She asks to check her feet/circulation noting that for a couple months that when she steps with her L foot she notices that the color of the top of her foot goes from reddish to white and feels a little tight.  This started a couple months ago.  Also feels like it tingles, is not numb, not "asleep" but tingles. No symptoms of claudication, she will get a cramp in her calf at night occassionally, never when ambulating She denies any other concerns. I asked her to try the nadolol daily and monitor symptoms, response to therapy. Her exam noted no findings to suggest PVD, no further w/u at that time, recommended f/u with her PMD and if persistent or progressive we could consider ABI/US.  She had an ER visit 01/13/20 with c/o palpitations and HTN, reported started feeling anxious and palpitations at work, coworker checked her BP with a wrist cuff measuring > 200 Nadolol was on her med list but reported as not taking. BP 150/100 no arrhythmias described Rec starting HCTZ  I saw her Nov 2022 She has identified times of anxiety as trigger for her palpitations, she has klonopin that she uses at these times and works quite well for her. She feels well otherwise She walks at work though no formal exercise She has bought a step machine and  is actively working on quitting smoking She retires next [employer] and thinks this will help a lot of her anxiety at least She is quite motivated to make exercise and quitting smoking priority Her PMD monitors and manages her lipids, these are reported to be good. No changes were made Encouraged exercise   TODAY She is doing GREAT!! Quit smoking in October Retiring [name], currently on a 6 week sabbatical. 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 She has rare if any palpitation since she quit smoking and nearing retirement.  Rare need for her clonazepam. They hired 3 people to fill her position.  No CP, SOB, DOE No near syncope or syncope.  Labs are done with her PMD   Past Medical History:  Diagnosis Date   Anxiety    Combined hyperlipidemia    Emphysema lung (HCC)    Hot flashes    Low back pain    Mixed hyperlipidemia    MVP (mitral valve prolapse)    Tobacco abuse     Past Surgical History:  Procedure Laterality Date   BILATERAL TUBAL LIGATION Bilateral    COLONOSCOPY     UTERINE POLYPECTOMY      Current Outpatient Medications  Medication Sig Dispense Refill   aspirin 81 MG tablet Take 81 mg by mouth daily.     atorvastatin (LIPITOR) 40 MG tablet Take 40 mg by mouth daily.     clonazePAM (KLONOPIN) 0.5 MG tablet Take 0.125 mg by mouth 2 (two) times daily as needed.   2   hydrochlorothiazide (HYDRODIURIL) 25 MG tablet Take 1 tablet (25 mg total) by mouth daily. 30 tablet 0   No current facility-administered medications for this visit.    Allergies:   Chantix [varenicline]   Social History:  The patient  reports that she has been smoking. She has never used smokeless tobacco. She reports current alcohol use. She reports that she does not use drugs.   Family History:  The patient's family history includes Breast cancer in her sister; Cancer in her maternal grandmother, sister, and sister; Heart attack in her father.  ROS:  Please see the history of present illness.  All other systems are reviewed and otherwise negative.   PHYSICAL EXAM:  VS:  There were no vitals taken for this visit. BMI: There is no height or weight on file to calculate BMI. Well nourished, well developed, in no acute distress  HEENT: normocephalic, atraumatic  Neck: no JVD, carotid bruits or masses Cardiac: RRR with a few extrasystoles; no significant murmurs, no rubs, or gallops Lungs:  CTA b/l, no wheezing, rhonchi or  rales  Abd: soft, nontender MS: no deformity or atrophy Ext: no edema b/l Skin: warm and dry, no rash Neuro:  No gross deficits appreciated Psych: euthymic mood, full affect     EKG:  done today and reviewed by myself SR 72bpm, unchanged    30 day monitor, oct 2020 Normal sinus rhythm with occasional PVCs. No pathologic arrhythmias.  11/22/2018 TTE IMPRESSIONS  1. Left ventricular ejection fraction, by visual estimation, is 55 to 60%. The left ventricle has normal function. Normal left ventricular size. There is no left ventricular hypertrophy.  2. Global right ventricle has normal systolic function.The right ventricular size is normal. No increase in right ventricular wall thickness.  3. Left atrial size was normal.  4. Right atrial size was normal.  5. The mitral valve is normal in structure. No evidence of mitral valve regurgitation.  No evidence of mitral stenosis.  6. The tricuspid valve is normal in structure. Tricuspid valve regurgitation is trivial.  7. The aortic valve is normal in structure. Aortic valve regurgitation was not visualized by color flow Doppler. Structurally normal aortic valve, with no evidence of sclerosis or stenosis.  8. The pulmonic valve was normal in structure. Pulmonic valve regurgitation is not visualized by color flow Doppler.  9. Normal pulmonary artery systolic pressure. 10. The inferior vena cava is normal in size with greater than 50% respiratory variability, suggesting right atrial pressure of 3 mmHg.     Recent Labs: No results found for requested labs within last 365 days.  No results found for requested labs within last 365 days.   CrCl cannot be calculated (Patient's most recent lab result is older than the maximum 21 days allowed.).   Wt Readings from Last 3 Encounters:  12/28/20 154 lb 9.6 oz (70.1 kg)  01/31/19 142 lb (64.4 kg)  12/02/18 147 lb (66.7 kg)     Other studies reviewed: Additional studies/records reviewed today  include: summarized above  ASSESSMENT AND PLAN:  1. Palpitations     Associated with anxiety and all but resolved as discussed above   2. HTN Looks good PMD does labs  Discussed need for new EP MD, perhaps not needing [place] at all though she would like to stay on board. Will plan to transition to Dr. Curt Bears    Disposition: back in a year, sooner if needed   Current medicines are reviewed at length with the patient today.  The patient did not have any concerns regarding medicines.  Venetia Night, PA-C 02/26/2022 4:45 PM     [address] [place]  [place] [address] [phone] (office)  [phone] (fax)

## 2022-03-01 ENCOUNTER — Ambulatory Visit: Payer: Medicare HMO | Attending: Physician Assistant | Admitting: Physician Assistant

## 2022-03-01 ENCOUNTER — Encounter: Payer: Self-pay | Admitting: Physician Assistant

## 2022-03-01 VITALS — BP 114/70 | HR 72 | Ht 65.5 in | Wt 155.0 lb

## 2022-03-01 DIAGNOSIS — I4729 Other ventricular tachycardia: Secondary | ICD-10-CM | POA: Diagnosis not present

## 2022-03-01 DIAGNOSIS — R002 Palpitations: Secondary | ICD-10-CM

## 2022-03-01 DIAGNOSIS — I10 Essential (primary) hypertension: Secondary | ICD-10-CM | POA: Diagnosis not present

## 2022-03-01 NOTE — Patient Instructions (Signed)
Medication Instructions:   Your physician recommends that you continue on your current medications as directed. Please refer to the Current Medication list given to you today.  *If you need a refill on your cardiac medications before your next appointment, please call your pharmacy*   Lab Work:  [place]   If you have labs (blood work) drawn today and your tests are completely normal, you will receive your results only by: [place] (if you have MyChart) OR A paper copy in the mail If you have any lab test that is abnormal or we need to change your treatment, we will call you to review the results.   Testing/Procedures: NONE ORDERED  TODAY    Follow-Up: At [facility], you and your health needs are our priority.  As part of our continuing mission to provide you with exceptional heart care, we have created designated Provider Care Teams.  These Care Teams include your primary Cardiologist (physician) and Advanced Practice Providers (APPs -  Physician Assistants and Nurse Practitioners) who all work together to provide you with the care you need, when you need it.  We recommend signing up for the patient portal called "MyChart".  Sign up information is provided on this After Visit Summary.  MyChart is used to connect with patients for Virtual Visits (Telemedicine).  Patients are able to view lab/test results, encounter notes, upcoming appointments, etc.  Non-urgent messages can be sent to your provider as well.   To learn more about what you can do with MyChart, go to [email or web address].    Your next appointment:   1 year(s)  Provider:   You may see Dr Curt Bears  or one of the following Advanced Practice Providers on your designated Care Team:   Tommye Standard, [place] Legrand Como "Jonni Sanger" Chalmers Cater, [place]  Other Instructions

## 2022-04-12 DIAGNOSIS — Z0100 Encounter for examination of eyes and vision without abnormal findings: Secondary | ICD-10-CM | POA: Diagnosis not present

## 2022-04-12 DIAGNOSIS — H5203 Hypermetropia, bilateral: Secondary | ICD-10-CM | POA: Diagnosis not present

## 2022-05-22 ENCOUNTER — Ambulatory Visit
Admission: RE | Admit: 2022-05-22 | Discharge: 2022-05-22 | Disposition: A | Discharge status: Home / Self Care | Payer: Medicare HMO | Source: Ambulatory Visit | Attending: Family Medicine | Admitting: Family Medicine

## 2022-05-22 DIAGNOSIS — F17210 Nicotine dependence, cigarettes, uncomplicated: Secondary | ICD-10-CM

## 2022-05-22 DIAGNOSIS — Z87891 Personal history of nicotine dependence: Secondary | ICD-10-CM

## 2022-05-22 DIAGNOSIS — Z122 Encounter for screening for malignant neoplasm of respiratory organs: Secondary | ICD-10-CM

## 2022-05-23 ENCOUNTER — Other Ambulatory Visit: Payer: Self-pay

## 2022-05-23 DIAGNOSIS — Z87891 Personal history of nicotine dependence: Secondary | ICD-10-CM

## 2022-05-23 DIAGNOSIS — F17210 Nicotine dependence, cigarettes, uncomplicated: Secondary | ICD-10-CM

## 2022-05-23 DIAGNOSIS — Z122 Encounter for screening for malignant neoplasm of respiratory organs: Secondary | ICD-10-CM

## 2022-05-25 DIAGNOSIS — I10 Essential (primary) hypertension: Secondary | ICD-10-CM | POA: Diagnosis not present

## 2022-05-25 DIAGNOSIS — H9313 Tinnitus, bilateral: Secondary | ICD-10-CM | POA: Diagnosis not present

## 2022-05-25 DIAGNOSIS — S76211A Strain of adductor muscle, fascia and tendon of right thigh, initial encounter: Secondary | ICD-10-CM | POA: Diagnosis not present

## 2022-05-25 DIAGNOSIS — Z0000 Encounter for general adult medical examination without abnormal findings: Secondary | ICD-10-CM | POA: Diagnosis not present

## 2022-05-25 DIAGNOSIS — R208 Other disturbances of skin sensation: Secondary | ICD-10-CM | POA: Diagnosis not present

## 2022-05-25 DIAGNOSIS — F419 Anxiety disorder, unspecified: Secondary | ICD-10-CM | POA: Diagnosis not present

## 2022-05-25 DIAGNOSIS — E782 Mixed hyperlipidemia: Secondary | ICD-10-CM | POA: Diagnosis not present

## 2022-05-25 DIAGNOSIS — I700 Atherosclerosis of aorta: Secondary | ICD-10-CM | POA: Diagnosis not present

## 2022-05-25 DIAGNOSIS — J439 Emphysema, unspecified: Secondary | ICD-10-CM | POA: Diagnosis not present

## 2022-06-05 DIAGNOSIS — M25551 Pain in right hip: Secondary | ICD-10-CM | POA: Diagnosis not present

## 2022-06-05 DIAGNOSIS — M25531 Pain in right wrist: Secondary | ICD-10-CM | POA: Diagnosis not present

## 2022-06-17 DIAGNOSIS — M25551 Pain in right hip: Secondary | ICD-10-CM | POA: Diagnosis not present

## 2022-06-19 ENCOUNTER — Encounter: Payer: Self-pay | Admitting: Podiatry

## 2022-06-19 ENCOUNTER — Ambulatory Visit (INDEPENDENT_AMBULATORY_CARE_PROVIDER_SITE_OTHER): Payer: Medicare HMO

## 2022-06-19 ENCOUNTER — Ambulatory Visit: Payer: Medicare HMO | Admitting: Podiatry

## 2022-06-19 DIAGNOSIS — M792 Neuralgia and neuritis, unspecified: Secondary | ICD-10-CM | POA: Diagnosis not present

## 2022-06-19 DIAGNOSIS — R0989 Other specified symptoms and signs involving the circulatory and respiratory systems: Secondary | ICD-10-CM

## 2022-06-19 DIAGNOSIS — Z720 Tobacco use: Secondary | ICD-10-CM | POA: Diagnosis not present

## 2022-06-19 NOTE — Progress Notes (Signed)
  Subjective:  Patient ID: Courtney Hernandez, female    DOB: Apr 23, 1955,   MRN: [id number]  Chief Complaint  Patient presents with   toe swelling     Patient state she's been having bilateral swelling and numbness in her toes associated with burning and redness. Patient states she is not a diabetic but this has been on going 6 months to 1 year.      67 y.o. female presents for concern as above. Relates tingling and odd sensations as well as redness and pain in her toes when her feet are elevated and at night. Patient is a smoker.  Denies any other pedal complaints. Denies n/v/f/c.   Past Medical History:  Diagnosis Date   Anxiety    Combined hyperlipidemia    Emphysema lung (HCC)    Hot flashes    Low back pain    Mixed hyperlipidemia    MVP (mitral valve prolapse)    Tobacco abuse     Objective:  Physical Exam: Vascular: PT pulses 2/4 bilateral. DP 1/4 bilateral CFT <5 seconds. Normal hair growth on digits. No edema. Erythema noted to distal digits that cleared slightly with elevation. Mild dependent rubor Skin. No lacerations or abrasions bilateral feet.  Musculoskeletal: MMT 5/5 bilateral lower extremities in DF, PF, Inversion and Eversion. Deceased ROM in DF of ankle joint. No pain to palpation noted about foot. Hammered digits 2-5 bilateral.  Neurological: Sensation intact to light touch.   Assessment:   1. Diminished pulses in lower extremity   2. Neuritis   3. Tobacco abuse      Plan:  Patient was evaluated and treated and all questions answered. Discussed  vascular issue vs neuritis vs neuropathy and etiology as well as treatment with patient.  Radiographs reviewed and discussed with patient. No acute fractures or dislocations noted.  -Discussed and educated patient on  foot care, especially with  regards to the vascular, neurological and musculoskeletal systems.  -Discussed supportive shoes at all times and checking feet regularly.  -Will order ABIs to rule out  vascular disesase.  -Patient to return after ABIs for possible workup of nerves in ABIs negative.   Louann Sjogren, DPM

## 2022-06-21 ENCOUNTER — Ambulatory Visit (HOSPITAL_COMMUNITY)
Admission: RE | Admit: 2022-06-21 | Discharge: 2022-06-21 | Disposition: A | Discharge status: Home / Self Care | Payer: Medicare HMO | Source: Ambulatory Visit | Attending: Podiatry | Admitting: Podiatry

## 2022-06-21 ENCOUNTER — Other Ambulatory Visit: Payer: Self-pay | Admitting: Podiatry

## 2022-06-21 DIAGNOSIS — Z720 Tobacco use: Secondary | ICD-10-CM

## 2022-06-21 DIAGNOSIS — R0989 Other specified symptoms and signs involving the circulatory and respiratory systems: Secondary | ICD-10-CM | POA: Insufficient documentation

## 2022-06-21 LAB — VAS US ABI WITH/WO TBI
Left ABI: 1.01
Right ABI: 1.13

## 2022-06-23 DIAGNOSIS — M25531 Pain in right wrist: Secondary | ICD-10-CM | POA: Diagnosis not present

## 2022-06-23 DIAGNOSIS — M1611 Unilateral primary osteoarthritis, right hip: Secondary | ICD-10-CM | POA: Diagnosis not present

## 2022-07-12 ENCOUNTER — Ambulatory Visit: Payer: Medicare HMO | Admitting: Vascular Surgery

## 2022-07-12 ENCOUNTER — Encounter: Payer: Self-pay | Admitting: Vascular Surgery

## 2022-07-12 VITALS — BP 110/75 | HR 83 | Temp 97.9°F | Resp 20 | Ht 65.5 in | Wt 153.6 lb

## 2022-07-12 DIAGNOSIS — I7389 Other specified peripheral vascular diseases: Secondary | ICD-10-CM | POA: Diagnosis not present

## 2022-07-12 NOTE — Progress Notes (Signed)
Patient ID: Courtney Hernandez, female   DOB: Jun 26, 1955, 67 y.o.   MRN: [id number]  Reason for Consult: New Patient (Initial Visit)   Referred by Tally Joe, MD  Subjective:     HPI:  Courtney Hernandez is a 67 y.o. female with vascular disease.  She is a longtime smoker also has hyperlipidemia.  She has been checked for aneurysm in the past this was negative.  She denies any history of stroke, TIA or amaurosis.  She does not have claudication, rest pain or tissue loss in her feet.  She states that what she really has is redness of her feet that increased with exposure to warm weather and this is also accompanied by pain and she also has neuropathy which is exacerbated when the toes are discolored.  She denies any history of vascular intervention in the lower extremity.  Past Medical History:  Diagnosis Date   Anxiety    Combined hyperlipidemia    Emphysema lung (HCC)    Hot flashes    Low back pain    Mixed hyperlipidemia    MVP (mitral valve prolapse)    Tobacco abuse    Family History  Problem Relation Age of Onset   Heart attack Father    Cancer Sister    Cancer Maternal Grandmother    Cancer Sister    Breast cancer Sister    Past Surgical History:  Procedure Laterality Date   BILATERAL TUBAL LIGATION Bilateral    COLONOSCOPY     UTERINE POLYPECTOMY      Short Social History:  Social History   Tobacco Use   Smoking status: Every Day    Packs/day: .5    Types: Cigarettes   Smokeless tobacco: Never  Substance Use Topics   Alcohol use: Yes    Allergies  Allergen Reactions   Chantix [Varenicline] Other (See Comments)    BAD DREAMS   Nadolol     Other Reaction(s): felt terrible   Wound Dressings Rash    Current Outpatient Medications  Medication Sig Dispense Refill   albuterol (VENTOLIN HFA) 108 (90 Base) MCG/ACT inhaler Inhale 2 puffs into the lungs every 6 (six) hours as needed.     aspirin 81 MG tablet Take 81 mg by mouth daily.     atorvastatin  (LIPITOR) 40 MG tablet Take 40 mg by mouth daily.     clonazePAM (KLONOPIN) 0.5 MG tablet Take 0.125 mg by mouth 2 (two) times daily as needed.   2   Cyanocobalamin 1000 MCG TBCR 1 tablet Orally Once a day     hydrochlorothiazide (HYDRODIURIL) 25 MG tablet Take 1 tablet (25 mg total) by mouth daily. 30 tablet 0   No current facility-administered medications for this visit.    Review of Systems  Constitutional:  Constitutional negative. HENT: HENT negative.  Eyes: Eyes negative.  Cardiovascular: Cardiovascular negative.  GI: Gastrointestinal negative.  Musculoskeletal:       Foot pain left greater than right Skin:       Red discoloration of toes Neurological: Neurological negative. Hematologic: Hematologic/lymphatic negative.  Psychiatric: Psychiatric negative.        Objective:  Objective   Vitals:   07/12/22 0940  BP: 110/75  Pulse: 83  Resp: 20  Temp: 97.9 F (36.6 C)  SpO2: 94%  Weight: 153 lb 9.6 oz (69.7 kg)  Height: 5' 5.5" (1.664 m)   Body mass index is 25.17 kg/m.  Physical Exam HENT:     Head: Normocephalic.  Nose: Nose normal.     Mouth/Throat:     Mouth: Mucous membranes are moist.  Eyes:     Pupils: Pupils are equal, round, and reactive to light.  Neck:     Vascular: No carotid bruit.  Cardiovascular:     Rate and Rhythm: Normal rate.     Pulses: Normal pulses.  Pulmonary:     Effort: Pulmonary effort is normal.  Abdominal:     General: Abdomen is flat.  Musculoskeletal:        General: Normal range of motion.     Cervical back: Normal range of motion.     Right lower leg: No edema.     Left lower leg: No edema.  Skin:    General: Skin is warm and dry.     Capillary Refill: Capillary refill takes less than 2 seconds.  Neurological:     General: No focal deficit present.     Mental Status: She is alert.  Psychiatric:        Mood and Affect: Mood normal.        Thought Content: Thought content normal.        Judgment: Judgment  normal.     Data: ABI Findings:  +---------+------------------+-----+---------+--------+  Right   Rt Pressure (mmHg)IndexWaveform Comment   +---------+------------------+-----+---------+--------+  Brachial 127                                       +---------+------------------+-----+---------+--------+  PTA     137               1.08 triphasic          +---------+------------------+-----+---------+--------+  DP      143               1.13 biphasic           +---------+------------------+-----+---------+--------+  Helayne Seminole                0.69 Abnormal           +---------+------------------+-----+---------+--------+   +---------+------------------+-----+---------+-------+  Left    Lt Pressure (mmHg)IndexWaveform Comment  +---------+------------------+-----+---------+-------+  Brachial 124                                      +---------+------------------+-----+---------+-------+  PTA     128               1.01 triphasic         +---------+------------------+-----+---------+-------+  DP      124               0.98 biphasic          +---------+------------------+-----+---------+-------+  Great Toe98                0.77 Normal            +---------+------------------+-----+---------+-------+   +-------+-----------+-----------+------------+------------+  ABI/TBIToday's ABIToday's TBIPrevious ABIPrevious TBI  +-------+-----------+-----------+------------+------------+  Right 1.13       0.69                                 +-------+-----------+-----------+------------+------------+  Left  1.01       0.77                                 +-------+-----------+-----------+------------+------------+  Summary:  Right: Resting right ankle-brachial index is within normal range. The  right toe-brachial index is abnormal.   Left: Resting left ankle-brachial index is within normal range. The left   toe-brachial index is normal.       Assessment/Plan:    67 year old female with history of redness discoloration of her toes particularly with exposure to warm weather.  I recommended complete cessation of smoking and protecting her toes as necessary.  She does have palpable pulses does not need any vascular invention at this time and can see me on an as-needed basis.     Maeola Harman MD [facility]

## 2022-07-26 ENCOUNTER — Telehealth: Payer: Self-pay | Admitting: Podiatry

## 2022-07-26 NOTE — Telephone Encounter (Signed)
Pt called and had a question for the provider but then said she also has a  new lump on her foot so I just scheduled pt to be seen.

## 2022-08-01 ENCOUNTER — Ambulatory Visit: Payer: Medicare HMO | Admitting: Podiatry

## 2022-08-23 ENCOUNTER — Ambulatory Visit: Payer: Medicare HMO | Admitting: Podiatry

## 2022-08-23 ENCOUNTER — Encounter: Payer: Self-pay | Admitting: Podiatry

## 2022-08-23 DIAGNOSIS — Z85828 Personal history of other malignant neoplasm of skin: Secondary | ICD-10-CM | POA: Diagnosis not present

## 2022-08-23 DIAGNOSIS — D1801 Hemangioma of skin and subcutaneous tissue: Secondary | ICD-10-CM | POA: Diagnosis not present

## 2022-08-23 DIAGNOSIS — R0989 Other specified symptoms and signs involving the circulatory and respiratory systems: Secondary | ICD-10-CM

## 2022-08-23 DIAGNOSIS — M722 Plantar fascial fibromatosis: Secondary | ICD-10-CM | POA: Diagnosis not present

## 2022-08-23 DIAGNOSIS — L570 Actinic keratosis: Secondary | ICD-10-CM | POA: Diagnosis not present

## 2022-08-23 DIAGNOSIS — M792 Neuralgia and neuritis, unspecified: Secondary | ICD-10-CM

## 2022-08-23 DIAGNOSIS — Z720 Tobacco use: Secondary | ICD-10-CM

## 2022-08-23 NOTE — Progress Notes (Signed)
Subjective:  Patient ID: Courtney Hernandez, female    DOB: 26-Jun-1955,   MRN: [id number]  Chief Complaint  Patient presents with   Foot Pain    Follow up vascular results - no changes    67 y.o. female presents for  follow-up of tingling and pain in her feet. Here to review results, has seen vascular and no intervention needed at this time. . Relates not changes in her feet.   Patient is a smoker.  Denies any other pedal complaints. Denies n/v/f/c.   Past Medical History:  Diagnosis Date   Anxiety    Combined hyperlipidemia    Emphysema lung (HCC)    Hot flashes    Low back pain    Mixed hyperlipidemia    MVP (mitral valve prolapse)    Tobacco abuse     Objective:  Physical Exam: Vascular: PT pulses 2/4 bilateral. DP 1/4 bilateral CFT <5 seconds. Normal hair growth on digits. No edema. Erythema noted to distal digits that cleared slightly with elevation. Mild dependent rubor Skin. No lacerations or abrasions bilateral feet. Small 0.5 cm mass frim noted to plantar medial arch that is mobile with the plantar fascia. No tenderness to palpation.  Musculoskeletal: MMT 5/5 bilateral lower extremities in DF, PF, Inversion and Eversion. Deceased ROM in DF of ankle joint. No pain to palpation noted about foot. Hammered digits 2-5 bilateral.  Neurological: Sensation intact to light touch.  ABI Findings:  +---------+------------------+-----+---------+--------+  Right   Rt Pressure (mmHg)IndexWaveform Comment   +---------+------------------+-----+---------+--------+  Brachial 127                                       +---------+------------------+-----+---------+--------+  PTA     137               1.08 triphasic          +---------+------------------+-----+---------+--------+  DP      143               1.13 biphasic           +---------+------------------+-----+---------+--------+  Helayne Semin[place]                0.69 Abnormal            +---------+------------------+-----+---------+--------+   +---------+------------------+-----+---------+-------+  Left    Lt Pressure (mmHg)IndexWaveform Comment  +---------+------------------+-----+---------+-------+  Brachial 124                                      +---------+------------------+-----+---------+-------+  PTA     128               1.01 triphasic         +---------+------------------+-----+---------+-------+  DP      124               0.98 biphasic          +---------+------------------+-----+---------+-------+  Great Toe98                0.77 Normal            +---------+------------------+-----+---------+-------+   +-------+-----------+-----------+------------+------------+  ABI/TBIToday's ABIToday's TBIPrevious ABIPrevious TBI  +-------+-----------+-----------+------------+------------+  Right 1.13       0.69                                 +-------+-----------+-----------+------------+------------+  Left  1.01       0.77                                 +-------+-----------+-----------+------------+------------+           Summary:  Right: Resting right ankle-brachial index is within normal range. The  right toe-brachial index is abnormal.   Left: Resting left ankle-brachial index is within normal range. The left  toe-brachial index is normal.    Assessment:   1. Diminished pulses in lower extremity   2. Tobacco abuse   3. Neuritis   4. Plantar fascial fibromatosis       Plan:  Patient was evaluated and treated and all questions answered. Discussed  vascular issue vs neuritis vs neuropathy and etiology as well as treatment with patient.  Radiographs reviewed and discussed with patient. No acute fractures or dislocations noted.  -Discussed and educated patient on  foot care, especially with  regards to the vascular, neurological and musculoskeletal systems.  -Discussed supportive shoes at all times  and checking feet regularly.  -Reiterated quiting smoking and see if this improves foot symptoms.  -Did discuss if after a 3-6 month period she is not noticing improvement then would consider neurologic work-up to determine nerve cause. Discussed the diagnosis of fibroma and treatment options with patient. Discussed offloading of the area and proper shoe wear. -Discussed stretching.  Patient to follow-up as needed if symptoms fail to improve or worsen.     Louann Sjogren, DPM

## 2022-10-30 DIAGNOSIS — L570 Actinic keratosis: Secondary | ICD-10-CM | POA: Diagnosis not present

## 2022-10-30 DIAGNOSIS — L821 Other seborrheic keratosis: Secondary | ICD-10-CM | POA: Diagnosis not present

## 2022-10-30 DIAGNOSIS — Z85828 Personal history of other malignant neoplasm of skin: Secondary | ICD-10-CM | POA: Diagnosis not present

## 2022-12-04 DIAGNOSIS — Z01419 Encounter for gynecological examination (general) (routine) without abnormal findings: Secondary | ICD-10-CM | POA: Diagnosis not present

## 2022-12-04 DIAGNOSIS — E7800 Pure hypercholesterolemia, unspecified: Secondary | ICD-10-CM | POA: Diagnosis not present

## 2022-12-04 DIAGNOSIS — Z124 Encounter for screening for malignant neoplasm of cervix: Secondary | ICD-10-CM | POA: Diagnosis not present

## 2022-12-04 DIAGNOSIS — Z720 Tobacco use: Secondary | ICD-10-CM | POA: Diagnosis not present

## 2022-12-04 DIAGNOSIS — Z1231 Encounter for screening mammogram for malignant neoplasm of breast: Secondary | ICD-10-CM | POA: Diagnosis not present

## 2022-12-04 DIAGNOSIS — Z01411 Encounter for gynecological examination (general) (routine) with abnormal findings: Secondary | ICD-10-CM | POA: Diagnosis not present

## 2022-12-04 DIAGNOSIS — Z6825 Body mass index (BMI) 25.0-25.9, adult: Secondary | ICD-10-CM | POA: Diagnosis not present

## 2023-03-06 ENCOUNTER — Ambulatory Visit (INDEPENDENT_AMBULATORY_CARE_PROVIDER_SITE_OTHER): Payer: Medicare HMO

## 2023-03-06 ENCOUNTER — Ambulatory Visit: Payer: Medicare HMO | Attending: Cardiology | Admitting: Cardiology

## 2023-03-06 ENCOUNTER — Encounter: Payer: Self-pay | Admitting: Cardiology

## 2023-03-06 VITALS — BP 114/70 | HR 80 | Ht 65.5 in | Wt 155.4 lb

## 2023-03-06 DIAGNOSIS — R002 Palpitations: Secondary | ICD-10-CM

## 2023-03-06 DIAGNOSIS — I4729 Other ventricular tachycardia: Secondary | ICD-10-CM

## 2023-03-06 DIAGNOSIS — I10 Essential (primary) hypertension: Secondary | ICD-10-CM | POA: Diagnosis not present

## 2023-03-06 NOTE — Patient Instructions (Signed)
Medication Instructions:  Your physician recommends that you continue on your current medications as directed. Please refer to the Current Medication list given to you today.  *If you need a refill on your cardiac medications before your next appointment, please call your pharmacy*   Lab Work: None ordered.  If you have labs (blood work) drawn today and your tests are completely normal, you will receive your results only by: MyChart Message (if you have MyChart) OR A paper copy in the mail If you have any lab test that is abnormal or we need to change your treatment, we will call you to review the results.   Testing/Procedures: Courtney Hernandez- Long Term Monitor Instructions  Your physician has requested you wear a ZIO patch monitor for 14 days.  This is a single patch monitor. [employer] supplies one patch monitor per enrollment. Additional stickers are not available. Please do not apply patch if you will be having a Nuclear Stress Test,  Echocardiogram, Cardiac CT, MRI, or Chest Xray during the period you would be wearing the  monitor. The patch cannot be worn during these tests. You cannot remove and re-apply the  ZIO XT patch monitor.  Your ZIO patch monitor will be mailed 3 day USPS to your address on file. It may take 3-5 days  to receive your monitor after you have been enrolled.  Once you have received your monitor, please review the enclosed instructions. Your monitor  has already been registered assigning a specific monitor serial # to you.  Billing and Patient Assistance Program Information  We have supplied [employer] with any of your insurance information on file for billing purposes. [employer] offers a sliding scale Patient Assistance Program for patients that do not have  insurance, or whose insurance does not completely cover the cost of the ZIO monitor.  You must apply for the Patient Assistance Program to qualify for this discounted rate.  To apply, please call [employer] at  [phone], select option 4, select option 2, ask to apply for  Patient Assistance Program. Meredeth Ide will ask your household income, and how many people  are in your household. They will quote your out-of-pocket cost based on that information.  [employer] will also be able to set up a 72-month, interest-free payment plan if needed.  Applying the monitor   Shave hair from upper left chest.  Hold abrader disc by orange tab. Rub abrader in 40 strokes over the upper left chest as  indicated in your monitor instructions.  Clean area with 4 enclosed alcohol pads. Let dry.  Apply patch as indicated in monitor instructions. Patch will be placed under collarbone on left  side of chest with arrow pointing upward.  Rub patch adhesive wings for 2 minutes. Remove white label marked "1". Remove the white  label marked "2". Rub patch adhesive wings for 2 additional minutes.  While looking in a mirror, press and release button in center of patch. A small green light will  flash 3-4 times. This will be your only indicator that the monitor has been turned on.  Do not shower for the first 24 hours. You may shower after the first 24 hours.  Press the button if you feel a symptom. You will hear a small click. Record Date, Time and  Symptom in the Patient Logbook.  When you are ready to remove the patch, follow instructions on the last 2 pages of Patient  Logbook. Stick patch monitor onto the last page of Patient Logbook.  Place Patient  Logbook in the blue and white box. Use locking tab on box and tape box closed  securely. The blue and white box has prepaid postage on it. Please place it in the mailbox as  soon as possible. Your physician should have your test results approximately 7 days after the  monitor has been mailed back to [facility].  Call [facility] Customer Care at [phone] if you have questions regarding  your ZIO XT patch monitor. Call them immediately if you see an orange light  blinking on your  monitor.  If your monitor falls off in less than 4 days, contact our Monitor department at [phone].  If your monitor becomes loose or falls off after 4 days call [employer] at [phone] for  suggestions on securing your monitor    Follow-Up: At [facility], you and your health needs are our priority.  As part of our continuing mission to provide you with exceptional heart care, we have created designated Provider Care Teams.  These Care Teams include your primary Cardiologist (physician) and Advanced Practice Providers (APPs -  Physician Assistants and Nurse Practitioners) who all work together to provide you with the care you need, when you need it.  We recommend signing up for the patient portal called "MyChart".  Sign up information is provided on this After Visit Summary.  MyChart is used to connect with patients for Virtual Visits (Telemedicine).  Patients are able to view lab/test results, encounter notes, upcoming appointments, etc.  Non-urgent messages can be sent to your provider as well.   To learn more about what you can do with MyChart, go to [email or web address].    Your next appointment:   12 months - Dr Elberta Fortis

## 2023-03-06 NOTE — Progress Notes (Unsigned)
Enrolled for [employer] to mail a ZIO XT long term holter monitor to the patients address on file.  

## 2023-03-06 NOTE — Progress Notes (Signed)
  Electrophysiology Office Note:   Date:  03/06/2023  ID:  Courtney, Hernandez 04-05-1955, MRN [id number]  Primary Cardiologist: None Primary Heart Failure: None Electrophysiologist: None      History of Present Illness:   Courtney Hernandez is a 68 y.o. female with h/o palpitations, hypertension seen today for routine electrophysiology followup.   Since last being seen in our clinic the patient reports doing well.  She has no chest pain or shortness of breath.  She has noted increasing palpitations that have occurred multiple times a week.  She can be sitting down has palpitations.  There are no exacerbating or alleviating factors.  They last for minutes at a time.  she denies chest pain, dyspnea, PND, orthopnea, nausea, vomiting, dizziness, syncope, edema, weight gain, or early satiety.   Review of systems complete and found to be negative unless listed in HPI.   EP Information / Studies Reviewed:    EKG is ordered today. Personal review as below.  EKG Interpretation Date/Time:  Tuesday March 06 2023 10:45:35 EST Ventricular Rate:  80 PR Interval:  130 QRS Duration:  82 QT Interval:  414 QTC Calculation: 477 R Axis:   76  Text Interpretation: Normal sinus rhythm Normal ECG When compared with ECG of 13-Jan-2020 10:28, No significant change was found Confirmed by [employer], Loveda Colaizzi ([id number]) on 03/06/2023 10:50:22 AM     Risk Assessment/Calculations:              Physical Exam:   VS:  BP 114/70 (BP Location: Left Arm, Patient Position: Sitting, Cuff Size: Normal)   Pulse 80   Ht 5' 5.5" (1.664 m)   Wt 155 lb 6.4 oz (70.5 kg)   SpO2 95%   BMI 25.47 kg/m    Wt Readings from Last 3 Encounters:  03/06/23 155 lb 6.4 oz (70.5 kg)  07/12/22 153 lb 9.6 oz (69.7 kg)  03/01/22 155 lb (70.3 kg)     GEN: Well nourished, well developed in no acute distress NECK: No JVD; No carotid bruits CARDIAC: Regular rate and rhythm, no murmurs, rubs, gallops RESPIRATORY:  Clear to  auscultation without rales, wheezing or rhonchi  ABDOMEN: Soft, non-tender, non-distended EXTREMITIES:  No edema; No deformity   ASSESSMENT AND PLAN:    1.  Palpitations: Has had a longstanding history of palpitations.  Which she is having now is somewhat different.  She is having multiple episodes a few times a week.  Ozil Stettler have her to wear a 2-week monitor for further determination.  2.  Hypertension: Currently well-controlled.  Plan per primary physician  Follow up with EP APP in 12 months  Signed, Damesha Lawler Jorja Loa, MD

## 2023-03-12 DIAGNOSIS — I4729 Other ventricular tachycardia: Secondary | ICD-10-CM | POA: Diagnosis not present

## 2023-03-12 DIAGNOSIS — R002 Palpitations: Secondary | ICD-10-CM | POA: Diagnosis not present

## 2023-03-19 DIAGNOSIS — L821 Other seborrheic keratosis: Secondary | ICD-10-CM | POA: Diagnosis not present

## 2023-03-19 DIAGNOSIS — L812 Freckles: Secondary | ICD-10-CM | POA: Diagnosis not present

## 2023-03-19 DIAGNOSIS — L853 Xerosis cutis: Secondary | ICD-10-CM | POA: Diagnosis not present

## 2023-03-19 DIAGNOSIS — Z85828 Personal history of other malignant neoplasm of skin: Secondary | ICD-10-CM | POA: Diagnosis not present

## 2023-03-19 DIAGNOSIS — D225 Melanocytic nevi of trunk: Secondary | ICD-10-CM | POA: Diagnosis not present

## 2023-04-02 DIAGNOSIS — J019 Acute sinusitis, unspecified: Secondary | ICD-10-CM | POA: Diagnosis not present

## 2023-04-02 DIAGNOSIS — I10 Essential (primary) hypertension: Secondary | ICD-10-CM | POA: Diagnosis not present

## 2023-04-02 DIAGNOSIS — Z720 Tobacco use: Secondary | ICD-10-CM | POA: Diagnosis not present

## 2023-04-03 DIAGNOSIS — I4729 Other ventricular tachycardia: Secondary | ICD-10-CM | POA: Diagnosis not present

## 2023-04-03 DIAGNOSIS — R002 Palpitations: Secondary | ICD-10-CM | POA: Diagnosis not present

## 2023-04-16 DIAGNOSIS — H5203 Hypermetropia, bilateral: Secondary | ICD-10-CM | POA: Diagnosis not present

## 2023-04-20 ENCOUNTER — Encounter: Payer: Self-pay | Admitting: Acute Care

## 2023-05-23 ENCOUNTER — Ambulatory Visit
Admission: RE | Admit: 2023-05-23 | Discharge: 2023-05-23 | Disposition: A | Discharge status: Home / Self Care | Source: Ambulatory Visit | Attending: Acute Care | Admitting: Acute Care

## 2023-05-23 DIAGNOSIS — F17210 Nicotine dependence, cigarettes, uncomplicated: Secondary | ICD-10-CM | POA: Diagnosis not present

## 2023-05-23 DIAGNOSIS — Z122 Encounter for screening for malignant neoplasm of respiratory organs: Secondary | ICD-10-CM

## 2023-05-23 DIAGNOSIS — Z87891 Personal history of nicotine dependence: Secondary | ICD-10-CM

## 2023-06-25 ENCOUNTER — Other Ambulatory Visit: Payer: Self-pay | Admitting: Acute Care

## 2023-06-25 DIAGNOSIS — Z122 Encounter for screening for malignant neoplasm of respiratory organs: Secondary | ICD-10-CM

## 2023-06-25 DIAGNOSIS — Z87891 Personal history of nicotine dependence: Secondary | ICD-10-CM

## 2023-06-25 DIAGNOSIS — F17210 Nicotine dependence, cigarettes, uncomplicated: Secondary | ICD-10-CM

## 2023-06-28 DIAGNOSIS — I10 Essential (primary) hypertension: Secondary | ICD-10-CM | POA: Diagnosis not present

## 2023-06-28 DIAGNOSIS — F419 Anxiety disorder, unspecified: Secondary | ICD-10-CM | POA: Diagnosis not present

## 2023-06-28 DIAGNOSIS — I341 Nonrheumatic mitral (valve) prolapse: Secondary | ICD-10-CM | POA: Diagnosis not present

## 2023-06-28 DIAGNOSIS — Z0000 Encounter for general adult medical examination without abnormal findings: Secondary | ICD-10-CM | POA: Diagnosis not present

## 2023-06-28 DIAGNOSIS — H9313 Tinnitus, bilateral: Secondary | ICD-10-CM | POA: Diagnosis not present

## 2023-06-28 DIAGNOSIS — J439 Emphysema, unspecified: Secondary | ICD-10-CM | POA: Diagnosis not present

## 2023-06-28 DIAGNOSIS — Z1331 Encounter for screening for depression: Secondary | ICD-10-CM | POA: Diagnosis not present

## 2023-06-28 DIAGNOSIS — E782 Mixed hyperlipidemia: Secondary | ICD-10-CM | POA: Diagnosis not present

## 2023-06-28 DIAGNOSIS — R300 Dysuria: Secondary | ICD-10-CM | POA: Diagnosis not present

## 2023-07-20 DIAGNOSIS — I10 Essential (primary) hypertension: Secondary | ICD-10-CM | POA: Diagnosis not present

## 2023-07-20 DIAGNOSIS — J439 Emphysema, unspecified: Secondary | ICD-10-CM | POA: Diagnosis not present

## 2023-07-23 DIAGNOSIS — J439 Emphysema, unspecified: Secondary | ICD-10-CM | POA: Diagnosis not present

## 2023-07-23 DIAGNOSIS — I10 Essential (primary) hypertension: Secondary | ICD-10-CM | POA: Diagnosis not present

## 2023-08-13 DIAGNOSIS — J439 Emphysema, unspecified: Secondary | ICD-10-CM | POA: Diagnosis not present

## 2023-08-13 DIAGNOSIS — I10 Essential (primary) hypertension: Secondary | ICD-10-CM | POA: Diagnosis not present

## 2023-08-13 DIAGNOSIS — E782 Mixed hyperlipidemia: Secondary | ICD-10-CM | POA: Diagnosis not present

## 2023-08-18 DIAGNOSIS — I10 Essential (primary) hypertension: Secondary | ICD-10-CM | POA: Diagnosis not present

## 2023-08-18 DIAGNOSIS — J439 Emphysema, unspecified: Secondary | ICD-10-CM | POA: Diagnosis not present

## 2023-09-13 DIAGNOSIS — I10 Essential (primary) hypertension: Secondary | ICD-10-CM | POA: Diagnosis not present

## 2023-09-13 DIAGNOSIS — E782 Mixed hyperlipidemia: Secondary | ICD-10-CM | POA: Diagnosis not present

## 2023-09-13 DIAGNOSIS — J439 Emphysema, unspecified: Secondary | ICD-10-CM | POA: Diagnosis not present

## 2023-09-17 DIAGNOSIS — J439 Emphysema, unspecified: Secondary | ICD-10-CM | POA: Diagnosis not present

## 2023-09-17 DIAGNOSIS — I10 Essential (primary) hypertension: Secondary | ICD-10-CM | POA: Diagnosis not present

## 2023-09-25 DIAGNOSIS — I341 Nonrheumatic mitral (valve) prolapse: Secondary | ICD-10-CM | POA: Diagnosis not present

## 2023-09-25 DIAGNOSIS — Z86018 Personal history of other benign neoplasm: Secondary | ICD-10-CM | POA: Diagnosis not present

## 2023-10-14 DIAGNOSIS — I10 Essential (primary) hypertension: Secondary | ICD-10-CM | POA: Diagnosis not present

## 2023-10-14 DIAGNOSIS — E782 Mixed hyperlipidemia: Secondary | ICD-10-CM | POA: Diagnosis not present

## 2023-10-14 DIAGNOSIS — J439 Emphysema, unspecified: Secondary | ICD-10-CM | POA: Diagnosis not present

## 2023-10-17 DIAGNOSIS — I10 Essential (primary) hypertension: Secondary | ICD-10-CM | POA: Diagnosis not present

## 2023-10-17 DIAGNOSIS — J439 Emphysema, unspecified: Secondary | ICD-10-CM | POA: Diagnosis not present

## 2023-11-13 DIAGNOSIS — J439 Emphysema, unspecified: Secondary | ICD-10-CM | POA: Diagnosis not present

## 2023-11-13 DIAGNOSIS — I10 Essential (primary) hypertension: Secondary | ICD-10-CM | POA: Diagnosis not present

## 2023-11-13 DIAGNOSIS — E782 Mixed hyperlipidemia: Secondary | ICD-10-CM | POA: Diagnosis not present

## 2023-11-16 DIAGNOSIS — I10 Essential (primary) hypertension: Secondary | ICD-10-CM | POA: Diagnosis not present

## 2023-11-16 DIAGNOSIS — J439 Emphysema, unspecified: Secondary | ICD-10-CM | POA: Diagnosis not present

## 2023-12-05 DIAGNOSIS — K5520 Angiodysplasia of colon without hemorrhage: Secondary | ICD-10-CM | POA: Diagnosis not present

## 2023-12-05 DIAGNOSIS — K5730 Diverticulosis of large intestine without perforation or abscess without bleeding: Secondary | ICD-10-CM | POA: Diagnosis not present

## 2023-12-05 DIAGNOSIS — D123 Benign neoplasm of transverse colon: Secondary | ICD-10-CM | POA: Diagnosis not present

## 2023-12-05 DIAGNOSIS — Z860101 Personal history of adenomatous and serrated colon polyps: Secondary | ICD-10-CM | POA: Diagnosis not present

## 2023-12-05 DIAGNOSIS — K648 Other hemorrhoids: Secondary | ICD-10-CM | POA: Diagnosis not present

## 2023-12-05 DIAGNOSIS — Z09 Encounter for follow-up examination after completed treatment for conditions other than malignant neoplasm: Secondary | ICD-10-CM | POA: Diagnosis not present

## 2023-12-14 DIAGNOSIS — J439 Emphysema, unspecified: Secondary | ICD-10-CM | POA: Diagnosis not present

## 2023-12-14 DIAGNOSIS — E782 Mixed hyperlipidemia: Secondary | ICD-10-CM | POA: Diagnosis not present

## 2023-12-14 DIAGNOSIS — I10 Essential (primary) hypertension: Secondary | ICD-10-CM | POA: Diagnosis not present

## 2023-12-16 DIAGNOSIS — J439 Emphysema, unspecified: Secondary | ICD-10-CM | POA: Diagnosis not present

## 2023-12-16 DIAGNOSIS — I10 Essential (primary) hypertension: Secondary | ICD-10-CM | POA: Diagnosis not present

## 2024-05-28 ENCOUNTER — Other Ambulatory Visit
